# Patient Record
Sex: Female | Born: 1980
Health system: Southern US, Community
[De-identification: ages and names within clinical notes are randomized; demographics above are authoritative.]

## PROBLEM LIST (undated history)

## (undated) DIAGNOSIS — E662 Morbid (severe) obesity with alveolar hypoventilation: Secondary | ICD-10-CM

## (undated) DIAGNOSIS — E876 Hypokalemia: Secondary | ICD-10-CM

## (undated) DIAGNOSIS — L732 Hidradenitis suppurativa: Secondary | ICD-10-CM

## (undated) DIAGNOSIS — E669 Obesity, unspecified: Secondary | ICD-10-CM

## (undated) DIAGNOSIS — Z720 Tobacco use: Secondary | ICD-10-CM

## (undated) HISTORY — PX: TUBAL LIGATION: SHX77

## (undated) HISTORY — PX: DILATION AND CURETTAGE OF UTERUS: SHX78

## (undated) HISTORY — PX: TONSILLECTOMY: SUR1361

---
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
## 2001-01-17 ENCOUNTER — Ambulatory Visit (HOSPITAL_COMMUNITY): Admission: RE | Admit: 2001-01-17 | Discharge: 2001-01-17 | Payer: Self-pay | Admitting: Obstetrics & Gynecology

## 2001-06-25 ENCOUNTER — Emergency Department (HOSPITAL_COMMUNITY): Admission: EM | Admit: 2001-06-25 | Discharge: 2001-06-25 | Payer: Self-pay

## 2002-06-14 ENCOUNTER — Other Ambulatory Visit: Admission: RE | Admit: 2002-06-14 | Discharge: 2002-06-14 | Payer: Self-pay | Admitting: Obstetrics & Gynecology

## 2002-09-23 ENCOUNTER — Emergency Department (HOSPITAL_COMMUNITY): Admission: EM | Admit: 2002-09-23 | Discharge: 2002-09-23 | Payer: Self-pay | Admitting: Emergency Medicine

## 2002-09-24 ENCOUNTER — Ambulatory Visit (HOSPITAL_COMMUNITY): Admission: RE | Admit: 2002-09-24 | Discharge: 2002-09-24 | Payer: Self-pay | Admitting: Obstetrics & Gynecology

## 2002-09-30 ENCOUNTER — Inpatient Hospital Stay (HOSPITAL_COMMUNITY): Admission: AD | Admit: 2002-09-30 | Discharge: 2002-09-30 | Payer: Self-pay | Admitting: Obstetrics and Gynecology

## 2002-12-01 ENCOUNTER — Inpatient Hospital Stay (HOSPITAL_COMMUNITY): Admission: AD | Admit: 2002-12-01 | Discharge: 2002-12-01 | Payer: Self-pay | Admitting: Obstetrics and Gynecology

## 2002-12-03 ENCOUNTER — Inpatient Hospital Stay (HOSPITAL_COMMUNITY): Admission: AD | Admit: 2002-12-03 | Discharge: 2002-12-03 | Payer: Self-pay | Admitting: Obstetrics and Gynecology

## 2002-12-06 ENCOUNTER — Inpatient Hospital Stay (HOSPITAL_COMMUNITY): Admission: AD | Admit: 2002-12-06 | Discharge: 2002-12-09 | Payer: Self-pay | Admitting: Obstetrics & Gynecology

## 2003-01-15 ENCOUNTER — Other Ambulatory Visit: Admission: RE | Admit: 2003-01-15 | Discharge: 2003-01-15 | Payer: Self-pay | Admitting: Obstetrics & Gynecology

## 2003-06-28 ENCOUNTER — Other Ambulatory Visit: Admission: RE | Admit: 2003-06-28 | Discharge: 2003-06-28 | Payer: Self-pay | Admitting: Obstetrics and Gynecology

## 2003-08-24 ENCOUNTER — Emergency Department (HOSPITAL_COMMUNITY): Admission: EM | Admit: 2003-08-24 | Discharge: 2003-08-25 | Payer: Self-pay | Admitting: Emergency Medicine

## 2003-12-21 ENCOUNTER — Emergency Department (HOSPITAL_COMMUNITY): Admission: EM | Admit: 2003-12-21 | Discharge: 2003-12-21 | Payer: Self-pay | Admitting: Emergency Medicine

## 2003-12-23 ENCOUNTER — Ambulatory Visit (HOSPITAL_COMMUNITY): Admission: RE | Admit: 2003-12-23 | Discharge: 2003-12-23 | Payer: Self-pay | Admitting: Emergency Medicine

## 2004-02-07 ENCOUNTER — Ambulatory Visit (HOSPITAL_COMMUNITY): Admission: RE | Admit: 2004-02-07 | Discharge: 2004-02-07 | Payer: Self-pay | Admitting: Obstetrics and Gynecology

## 2004-02-12 ENCOUNTER — Other Ambulatory Visit: Admission: RE | Admit: 2004-02-12 | Discharge: 2004-02-12 | Payer: Self-pay | Admitting: Obstetrics and Gynecology

## 2004-02-21 ENCOUNTER — Ambulatory Visit (HOSPITAL_COMMUNITY): Admission: RE | Admit: 2004-02-21 | Discharge: 2004-02-21 | Payer: Self-pay | Admitting: Obstetrics and Gynecology

## 2004-02-26 ENCOUNTER — Inpatient Hospital Stay (HOSPITAL_COMMUNITY): Admission: AD | Admit: 2004-02-26 | Discharge: 2004-02-27 | Payer: Self-pay | Admitting: Obstetrics and Gynecology

## 2004-09-03 ENCOUNTER — Emergency Department (HOSPITAL_COMMUNITY): Admission: EM | Admit: 2004-09-03 | Discharge: 2004-09-03 | Payer: Self-pay | Admitting: Emergency Medicine

## 2004-11-10 ENCOUNTER — Emergency Department (HOSPITAL_COMMUNITY): Admission: EM | Admit: 2004-11-10 | Discharge: 2004-11-11 | Payer: Self-pay | Admitting: Emergency Medicine

## 2005-04-29 ENCOUNTER — Other Ambulatory Visit: Admission: RE | Admit: 2005-04-29 | Discharge: 2005-04-29 | Payer: Self-pay | Admitting: Obstetrics & Gynecology

## 2006-01-11 ENCOUNTER — Emergency Department (HOSPITAL_COMMUNITY): Admission: EM | Admit: 2006-01-11 | Discharge: 2006-01-11 | Payer: Self-pay | Admitting: Emergency Medicine

## 2006-04-26 ENCOUNTER — Emergency Department (HOSPITAL_COMMUNITY): Admission: EM | Admit: 2006-04-26 | Discharge: 2006-04-26 | Payer: Self-pay | Admitting: Emergency Medicine

## 2006-04-27 ENCOUNTER — Inpatient Hospital Stay (HOSPITAL_COMMUNITY): Admission: AD | Admit: 2006-04-27 | Discharge: 2006-04-27 | Payer: Self-pay | Admitting: Gynecology

## 2007-07-05 ENCOUNTER — Emergency Department (HOSPITAL_COMMUNITY): Admission: EM | Admit: 2007-07-05 | Discharge: 2007-07-05 | Payer: Self-pay | Admitting: Emergency Medicine

## 2007-08-01 ENCOUNTER — Emergency Department (HOSPITAL_COMMUNITY): Admission: EM | Admit: 2007-08-01 | Discharge: 2007-08-01 | Payer: Self-pay | Admitting: Emergency Medicine

## 2007-08-24 ENCOUNTER — Emergency Department (HOSPITAL_COMMUNITY): Admission: EM | Admit: 2007-08-24 | Discharge: 2007-08-25 | Payer: Self-pay | Admitting: Emergency Medicine

## 2008-01-30 ENCOUNTER — Emergency Department (HOSPITAL_COMMUNITY): Admission: EM | Admit: 2008-01-30 | Discharge: 2008-01-30 | Payer: Self-pay | Admitting: Emergency Medicine

## 2008-02-12 ENCOUNTER — Inpatient Hospital Stay (HOSPITAL_COMMUNITY): Admission: AD | Admit: 2008-02-12 | Discharge: 2008-02-12 | Payer: Self-pay | Admitting: Obstetrics & Gynecology

## 2009-10-08 ENCOUNTER — Emergency Department (HOSPITAL_COMMUNITY): Admission: EM | Admit: 2009-10-08 | Discharge: 2009-10-08 | Payer: Self-pay | Admitting: Emergency Medicine

## 2010-01-12 ENCOUNTER — Emergency Department (HOSPITAL_COMMUNITY): Admission: EM | Admit: 2010-01-12 | Discharge: 2010-01-12 | Payer: Self-pay | Admitting: Emergency Medicine

## 2010-12-20 LAB — WET PREP, GENITAL
Clue Cells Wet Prep HPF POC: NONE SEEN
WBC, Wet Prep HPF POC: NONE SEEN
Yeast Wet Prep HPF POC: NONE SEEN

## 2010-12-20 LAB — CBC
HCT: 37.3 % (ref 36.0–46.0)
Hemoglobin: 12.7 g/dL (ref 12.0–15.0)
MCHC: 34 g/dL (ref 30.0–36.0)
Platelets: 282 10*3/uL (ref 150–400)
RBC: 4.69 MIL/uL (ref 3.87–5.11)
RDW: 14.9 % (ref 11.5–15.5)

## 2010-12-20 LAB — DIFFERENTIAL
Basophils Absolute: 0.1 10*3/uL (ref 0.0–0.1)
Lymphs Abs: 3.7 10*3/uL (ref 0.7–4.0)
Monocytes Absolute: 0.6 10*3/uL (ref 0.1–1.0)
Monocytes Relative: 4 % (ref 3–12)

## 2010-12-20 LAB — URINALYSIS, ROUTINE W REFLEX MICROSCOPIC
Bilirubin Urine: NEGATIVE
Glucose, UA: NEGATIVE mg/dL
Ketones, ur: NEGATIVE mg/dL
Nitrite: NEGATIVE
Specific Gravity, Urine: 1.013 (ref 1.005–1.030)

## 2010-12-20 LAB — URINE MICROSCOPIC-ADD ON

## 2010-12-20 LAB — GC/CHLAMYDIA PROBE AMP, GENITAL: GC Probe Amp, Genital: NEGATIVE

## 2011-02-19 NOTE — Discharge Summary (Signed)
NAME:  Elizabeth Campbell, Elizabeth Campbell                           ACCOUNT NO.:  [id number]   MEDICAL RECORD NO.:  [id number]                   PATIENT TYPE:  INP   LOCATION:  9129                                 FACILITY:  [facility]   PHYSICIAN:  Dineen Kid. Rana Snare, M.D.                 DATE OF BIRTH:  21-Mar-1981   DATE OF ADMISSION:  12/06/2002  DATE OF DISCHARGE:  12/09/2002                                 DISCHARGE SUMMARY   ADMITTING DIAGNOSES:  1. Intrauterine pregnancy at 37-1/2 weeks estimated gestational age.  2. Mild pregnancy induced hypertension.   DISCHARGE DIAGNOSES:  1. Status post low transverse cesarean section secondary to nonreassuring     fetal heart tones and failure to progress.  2. Viable female infant.   PROCEDURE:  Primary low transverse cesarean section.   REASON FOR ADMISSION:  Please see dictated H&P.   HOSPITAL COURSE:  The patient was a 30 year old primigravida who was  admitted at 37-1/2 weeks estimated gestational age with mild elevation of  blood pressure and trace proteinuria.  Pregnancy induced hypertension  laboratories were within normal limits.  Amniotomy was performed and Pitocin  IV was administered per protocol to augment labor pattern.  Epidural was  placed for patient's comfort.  Intrauterine pressure catheter was placed for  monitoring of adequate labor.  After several hours of adequate labor patient  showed poor cervical progress.  Fetus also began to show some variable  decelerations with a late component.  Based on nonreassuring fetal heart  rate pattern and poor cervical progress, decision was made to proceed with a  low transverse cesarean section.  The patient was taken to the operating  room where epidural was dosed to an adequate surgical level.  A low  transverse incision was made with the delivery of a viable female infant  weighing 6 pounds 2 ounces with Apgars of 8 at one minute, 9 at five  minutes.  Umbilical cord pH was 7.31.  The patient tolerated  procedure well  and was taken to the recovery room in stable condition.  On postoperative  day one patient had good return of bowel function.  Abdomen was soft.  Abdominal dressing was clean, dry, and intact.  Laboratories revealed  hemoglobin of 9.8, platelet count of 325,000, WBC count of 14.7.  On  postoperative day two patient had mild elevation of temperature to 100.3.  Abdominal dressing was removed.  We found an incision that was clean, dry,  and intact.  Laboratories revealed hemoglobin of 10.1, platelet count of  332,000, WBC count of 14.8.  On postoperative day three patient was  afebrile.  Fundus was firm and nontender.  Incision was clean, dry, and  intact.  Staples were removed and patient was discharged home.   CONDITION ON DISCHARGE:  Good.   DIET:  Regular, as tolerated.   ACTIVITY:  No heavy lifting.  No driving x2  weeks.  No vaginal entry.   FOLLOW UP:  The patient is to follow up in the office in one to two weeks  for an incision check.  She is to call for temperature greater than 100  degrees, persistent nausea and vomiting, heavy vaginal bleeding, and/or  redness or drainage from the incisional site.    DISCHARGE MEDICATIONS:  1. Tylox numbers 30 one p.o. q.4-6h. p.r.n.  2. Motrin 600 mg q.6h.  3. Prenatal vitamins one p.o. daily.  4. Colace one p.o. daily p.r.n.     Julio Sicks, N.P.                        Dineen Kid Rana Snare, M.D.    CC/MEDQ  D:  12/31/2002  T:  12/31/2002  Job:  [id number]

## 2011-02-19 NOTE — Op Note (Signed)
[facility]  Patient:    Elizabeth Campbell, Elizabeth Campbell                        MRN: [id number] Proc. Date: 01/17/01 Adm. Date:  [id number] Attending:  Minette Headland                           Operative Report  PREOPERATIVE DIAGNOSES:  Severe dysmenorrhea, previous history of exploratory laparotomy with left salpingectomy for torsion of left fallopian tube, suspected adhesions and/or endometriosis.  POSTOPERATIVE DIAGNOSES:  Severe dysmenorrhea, previous history of exploratory laparotomy with left salpingectomy for torsion of left fallopian tube, suspected adhesions and/or endometriosis.  No evidence of pelvic endometriosis with adhesions of the sigmoid colon to the left pelvic sidewall and no other abnormal findings.  OPERATION:  Diagnostic laparoscopy, lysis of left pelvic adhesions and uterosacral nerve ablation by bipolar fulguration of uterosacral ligaments.  ANESTHESIA:  General endotracheal anesthesia.  ESTIMATED INTRAOPERATIVE BLOOD LOSS:  5 cc.  INTRAOPERATIVE COMPLICATIONS:  None.  INDICATIONS:  The patient is an 30 year old with significant GYN history with removal of left tube in the past.  She has been tried on continuous oral contraceptives which have not worked very effectively and she continues to complain of very severe dysmenorrhea even on oral contraceptives.  She is admitted now for laparoscopy.  FINDINGS:  Recorded in still photographs retained in the office record and/or as noted above.  DESCRIPTION OF PROCEDURE:  The patient was admitted on the morning of surgery, and brought to the operating room and placed under adequate general endotracheal anesthesia, placed in the dorsal lithotomy position.  Betadine prep of abdomen and perineum and vagina was carried out in the usual fashion. Matter was evacuated with a Robinson catheter.  Cervix was visualized and Hulka tenaculum attached without significant difficulty. Sterile drapes  were applied.  Two small incisions were made, one through an old scar at the umbilicus and one just above the symphysis.  An 11 mm disposable trocar was introduced at the umbilical incision while elevating the anterior abdominal wall.  The trocar did penetrate the omentum but no other structures and there was no bleeding from the omental perforation with the trocar.  Pneumoperitoneum was allowed to accumulate with carbon dioxide gas. Systematic examination of the pelvic and abdominal contents was carried out and ______ were made and retained in the record.  Using the dissecting reticulated scissors the adhesions around the sigmoid colon were dissected, to free it from the lateral pelvic sidewall.  The uterus was  put on stretch with the Hulka tenaculum and the uterosacral ligaments ablated at their junction with the cervix using the bipolar Klepinger forceps. Cauterization of the adhesions bases was also carried out.  Reduced intra-abdominal pressure was used to confirm complete hemostasis.  The procedure was then terminated.  All the gas was allowed to escape from the abdomen.  Skin incisions were closed with interrupted subcuticular sutures of 3-0 Dexon.  Steri-Strips were applied to the lower incision.  Then 0.50% plain Marcaine was injected into the incision sites for postoperative analgesia. The patient was given 30 mg of Toradol slow IV pressure intraoperatively for postoperative analgesia.  She has Vicodin at home to be taken for postoperative pain.  She is to return to the office in 7 to 10 days for postoperative followup.  She is given routine outpatient surgical instructions. DD:  01/17/01 TD:  01/17/01 Job: [id number]

## 2011-02-19 NOTE — H&P (Signed)
NAME:  Elizabeth Campbell, Elizabeth Campbell                           ACCOUNT NO.:  [id number]   MEDICAL RECORD NO.:  [id number]                   PATIENT TYPE:  AMB   LOCATION:  SDC                                  FACILITY:  [facility]   PHYSICIAN:  Zenaida Niece, M.D.             DATE OF BIRTH:  10-Nov-1980   DATE OF ADMISSION:  02/21/2004  DATE OF DISCHARGE:                                HISTORY & PHYSICAL   CHIEF COMPLAINT:  Pelvic pain.   HISTORY OF PRESENT ILLNESS:  This is a 30 year old white female, gravida 1,  para 1-0-0-1 whom I first saw on April 22 of this year. At that time, she  complained of left pelvic pain for three months which radiated to the right  side. The pain was there most of the time and it was worse with her menses.  She has previously been on Depo-Provera with her last injection in October  2004.  She has recently had menses every two weeks for the past three months  and these are very painful.  She also complains of deep dyspareunia for  approximately three months that happens most of the time.  Exam at that time  revealed no adnexal masses but she was tender on the left. Options were  discussed with her at that time and we elected to proceed with a course of  antibiotics with doxycycline and try NuvaRing and over the counter  nonsteroidals.  She then continued to have pain which required Vicodin for  control.  She had a pelvic ultrasound which was normal.  I saw her again on  May 11. She is having regular menses with the NuvaRing but still has  intermittent pain and cramping which is mostly on the left and does still  have dyspareunia.  The Vicodin helps with the pain but keeps her awake. She  does have some urinary urgency and frequency but no nocturia and some  urgency with bowel movements also.  Exam remains consistent without masses  but she is tender on the left.  The patient wishes to proceed with  evaluation with laparoscopy.   PAST OB HISTORY:  In 2004, she had a  cesarean section at 39 weeks for a  nonreassuring fetal heart tracing and baby weighed 6 pounds, 2 ounces.   PAST MEDICAL HISTORY:  Hypothyroidism and obesity.   PAST GYN HISTORY:  History of CIN 1.   PAST SURGICAL HISTORY:  She has had a cesarean section as well as a  laparoscopy followed by a laparotomy with left salpingectomy in 1998 for a  possible torsed tube.  In 2002, she had a laparoscopy with adhesiolysis and  laparoscopic uterosacral nerve ablation.   ALLERGIES:  None known.   CURRENT MEDICATIONS:  Vicodin p.r.n., Tylenol p.r.n., Aleve p.r.n. and  Nuvaring.   SOCIAL HISTORY:  The patient is single and does smoke a 1/2 pack of  cigarettes a day and  denies significant alcohol or drug use.   FAMILY HISTORY:  The patient's mother had cervical cancer.   REVIEW OF SYMPTOMS:  GU and GI as above.  She also complains of fatigue and  possibly being depressed.   PHYSICAL EXAMINATION:  VITAL SIGNS:  Weight is 263 pounds, blood pressure is  146/98, pulse 80.  GENERAL:  This is an obese white female in no acute distress.  NECK:  Supple without lymphadenopathy or thyromegaly.  LUNGS:  Clear to auscultation.  HEART:  Regular rate and rhythm without murmur.  ABDOMEN:  Obese, nontender, without palpable masses and she does have well  healed laparoscopic and transverse scars.  BREASTS:  Examined in the sitting and supine position reveals no dominant  masses, adenopathy, skin change or nipple discharge.  PELVIC:  External genitalia is normal.  On speculum exam, the cervix is  normal and a Pap smear was performed which has returned with ascus.  On  bimanual exam, she has a small anteverted mid plane or slightly tender  uterus with no adnexal masses and she is slightly tender on the left.   ASSESSMENT:  Chronic pelvic pain that has not responded to medical therapy.  Options have been discussed with the patient and although she has had two  prior laparoscopies she does want to proceed  with laparoscopic evaluation.  The risks of surgery including bleeding, infection, and damage to  surrounding organs have been discussed with the patient.   PLAN:  Admit the patient on the day of surgery for an open diagnostic  laparoscopy with possible adhesiolysis and possible fulguration of  endometriosis.                                               Zenaida Niece, M.D.    TDM/MEDQ  D:  02/20/2004  T:  02/20/2004  Job:  [id number]

## 2011-02-19 NOTE — Op Note (Signed)
NAME:  Elizabeth Campbell, CRASS                           ACCOUNT NO.:  [id number]   MEDICAL RECORD NO.:  [id number]                   PATIENT TYPE:  INP   LOCATION:  9129                                 FACILITY:  [facility]   PHYSICIAN:  Freddy Finner, M.D.                DATE OF BIRTH:  Sep 13, 1981   DATE OF PROCEDURE:  12/06/2002  DATE OF DISCHARGE:                                 OPERATIVE REPORT   PREOPERATIVE DIAGNOSES:  1. Intrauterine pregnancy at 37-and-a-half weeks gestation.  2. Nonreassuring fetal heart tracing.  3. Failure to progress in labor.  4. Mild late gestational hypertension.   POSTOPERATIVE DIAGNOSES:  1. Intrauterine pregnancy at 37-and-a-half weeks gestation.  2. Nonreassuring fetal heart tracing.  3. Failure to progress in labor.  4. Mild late gestational hypertension.  5. Delivery of viable female infant, Apgars of 8 and 9.  Cord pH was 7.31.   SURGEON:  Freddy Finner, M.D.   ASSISTANT:  Nurse.   OPERATIVE PROCEDURE:  Primary low transverse cervical cesarean section.   ESTIMATED INTRAOPERATIVE BLOOD LOSS:  600-800 mL.   ANESTHESIA:  Epidural.   INTRAOPERATIVE COMPLICATIONS:  None.   INDICATIONS FOR PROCEDURE:  The patient was admitted in latent phase of  labor with mild increase in blood pressure and a trace of proteinuria.  Her  PIH labs were normal.  Amniotomy was performed.  Subsequently, the patient  was augmented with IV Pitocin.  She was placed under epidural anesthesia.  She had an intrauterine pressure transducer which documented adequate labor  over the last two hours of her labor profile.  She made very slow progress,  and very little if any progress over the last two hours.  Some variable  decelerations were noted which were kind of late in onset.  These were  nonreassuring, and based on her failure to progress to this point is was  elected to proceed with cesarean delivery.   DESCRIPTION OF PROCEDURE:  The patient was brought to the operating  room  where epidural was dosed for surgery.  A Foley catheter was indwelling.  The  abdomen was prepped and draped in the usual fashion.  A lower abdominal  transverse skin incision was made through an old scar and carried sharply  down to the fascia which was entered sharply and extended to the extent of  the skin incision.  The rectus sheath was developed superiorly and  inferiorly with blunt and sharp dissection.  The rectus muscle was divided  in the midline.  The peritoneum was entered sharply and extended bluntly to  the extent of the skin incision.  The bladder blade was placed.  A  transverse incision was made in the visceral peritoneum overlying the lower  uterine segment which was then bluntly dissected off the lower segment.  The  lower segment was entered transversely and extended bluntly in a transverse  direction.  A viable female infant was then delivered without difficulty.  Apgars and cord pH are noted above.  Cord blood was obtained for routine and  for arterial sample.  Placenta and other products of conception were removed  from the uterus and this was confirmed complete by manual exploration.  The  uterus was delivered through the incision.  The right tube and ovary were  normal.  The left ovary was normal.  The left tube is surgically absent.  The uterus itself was normal.  The uterine incision was closed in a single  layer of running locking 0 Monocryl.  A figure-of-eight was required at the  right margin for complete hemostasis.  The bladder flap was reapproximated  with an interrupted 0 Monocryl.  Irrigation was carried out.  Hemostatis was  adequate and complete.  Abdominal incision was closed in layers.  Running 0  Monocryl was used to close the peritoneum and rectus muscles.  The fascia  was closed in a running 0 PDS.  Subcutaneous tissue was closed with running  2-0 plain.  Skin was closed with wide skin staples and covered in Steri-  Strips.  The patient  tolerated the operative procedure well and was taken to  recovery in good condition.                                               Freddy Finner, M.D.    WRN/MEDQ  D:  12/07/2002  T:  12/07/2002  Job:  [id number]

## 2011-02-19 NOTE — Op Note (Signed)
NAME:  Elizabeth Campbell, Elizabeth Campbell                           ACCOUNT NO.:  [id number]   MEDICAL RECORD NO.:  [id number]                   PATIENT TYPE:  AMB   LOCATION:  SDC                                  [facility]ACILITY:  [facility]   PHYSICIAN:  Zenaida Niece, M.D.             DATE OF BIRTH:  14-Mar-1981   DATE OF PROCEDURE:  02/21/2004  DATE OF DISCHARGE:                                 OPERATIVE REPORT   PREOPERATIVE DIAGNOSES:  Pelvic pain.   POSTOPERATIVE DIAGNOSES:  Pelvic pain with left pelvic adhesions.   SURGEON:  Open diagnostic laparoscopy with adhesiolysis.   SURGEON:  Zenaida Niece, M.D.   ANESTHESIA:  General endotracheal tube.   ESTIMATED BLOOD LOSS:  Less than 50 mL.   FINDINGS:  She had adhesions of the left tube and ovary to the left pelvic  sidewall and sigmoid colon.  She had an otherwise normal uterus, right tube  and ovary, appendix, liver edge, gallbladder and no evidence of  endometriosis or other significant pathology.   DESCRIPTION OF PROCEDURE:  The patient was taken to the operating room and  placed in the dorsal supine position. General anesthesia was induced and she  was placed in mobile stirrups. The abdomen was prepped and draped in the  usual sterile fashion, bladder drained with a red rubber catheter, Hulka  tenaculum applied to the cervix for uterine manipulation.  The  infraumbilical skin was then infiltrated with 0.25% Marcaine and a 3 cm  horizontal incision was made.  Dissection was carried down to the fascia  which was elevated and entered sharply.  The peritoneum was then elevated  and entered sharply and bluntly. A pursestring suture of #0 Vicryl was  placed around the fascia and the Hasson cannula inserted.  CO2 gas was used  to insufflate the abdomen.  Inspection with the laparoscope revealed the  above mentioned findings.  A 5 mm port was placed low in the midline under  direct visualization.  No source for pain was found on the right side. The  adhesions on the left side were felt to possibly be due to pain. These  adhesions were taken down sharply and bluntly. Unipolar cautery was used  with the scissors being careful to avoid bowel to take down some of these  adhesions. I was able to free up the left tube and ovary well enough to  trace the tube to its end. The distal tube was adherent to the ovary and the  end of the tube did appear to be slightly clubbed.  The anatomy was  otherwise normal and the ureter was identified well inferior to any  incisions. There was a small amount of bleeding encountered during the blunt  dissection of adhesions but nothing significant.  The remainder of the  pelvis and abdomen was normal. The 5 mm port was removed under direct  visualization. The laparoscope was removed and the Hasson  cannula removed  and gas allowed to deflate from the abdomen. The previously placed  pursestring suture was then tied. The skin incisions were closed with  subcuticular sutures of 4-0  Vicryl followed by Steri-Strips and bandages. The Hulka tenaculum was then  removed from the cervix. The patient was awakened in the operating room,  tolerated the procedure well and was taken to the recovery room in stable  condition. Counts were correct x2 and she had PAS hose all throughout the  procedure.                                               Zenaida Niece, M.D.    TDM/MEDQ  D:  02/21/2004  T:  02/22/2004  Job:  [id number]

## 2011-05-31 ENCOUNTER — Emergency Department (HOSPITAL_COMMUNITY)
Admission: EM | Admit: 2011-05-31 | Discharge: 2011-05-31 | Disposition: A | Discharge status: Home / Self Care | Payer: Self-pay | Attending: Emergency Medicine | Admitting: Emergency Medicine

## 2011-05-31 DIAGNOSIS — R21 Rash and other nonspecific skin eruption: Secondary | ICD-10-CM | POA: Insufficient documentation

## 2011-05-31 DIAGNOSIS — L298 Other pruritus: Secondary | ICD-10-CM | POA: Insufficient documentation

## 2011-05-31 DIAGNOSIS — L089 Local infection of the skin and subcutaneous tissue, unspecified: Secondary | ICD-10-CM | POA: Insufficient documentation

## 2011-05-31 DIAGNOSIS — L259 Unspecified contact dermatitis, unspecified cause: Secondary | ICD-10-CM | POA: Insufficient documentation

## 2011-05-31 DIAGNOSIS — L2989 Other pruritus: Secondary | ICD-10-CM | POA: Insufficient documentation

## 2011-06-30 LAB — HCG, SERUM, QUALITATIVE: Preg, Serum: NEGATIVE

## 2011-06-30 LAB — POCT PREGNANCY, URINE
Operator id: 181461
Preg Test, Ur: NEGATIVE

## 2011-07-14 LAB — URINALYSIS, ROUTINE W REFLEX MICROSCOPIC
Ketones, ur: NEGATIVE
Nitrite: NEGATIVE
Protein, ur: NEGATIVE
Specific Gravity, Urine: 1.001 — ABNORMAL LOW
pH: 6.5

## 2011-07-14 LAB — GC/CHLAMYDIA PROBE AMP, GENITAL: GC Probe Amp, Genital: NEGATIVE

## 2011-07-14 LAB — HEMOGLOBIN AND HEMATOCRIT, BLOOD: HCT: 35.4 — ABNORMAL LOW

## 2011-07-14 LAB — WET PREP, GENITAL
Clue Cells Wet Prep HPF POC: NONE SEEN
WBC, Wet Prep HPF POC: NONE SEEN
Yeast Wet Prep HPF POC: NONE SEEN

## 2011-07-15 LAB — DIFFERENTIAL
Basophils Relative: 0
Eosinophils Absolute: 0.2
Monocytes Relative: 2 — ABNORMAL LOW
Neutrophils Relative %: 73

## 2011-07-15 LAB — CBC
MCHC: 33.5
MCV: 76.5 — ABNORMAL LOW
Platelets: 371
RBC: 4.19
RDW: 16.7 — ABNORMAL HIGH

## 2013-01-03 ENCOUNTER — Encounter (HOSPITAL_BASED_OUTPATIENT_CLINIC_OR_DEPARTMENT_OTHER): Payer: Self-pay | Admitting: *Deleted

## 2013-01-03 ENCOUNTER — Emergency Department (HOSPITAL_BASED_OUTPATIENT_CLINIC_OR_DEPARTMENT_OTHER)
Admission: EM | Admit: 2013-01-03 | Discharge: 2013-01-03 | Disposition: A | Discharge status: Home / Self Care | Payer: Self-pay | Attending: Emergency Medicine | Admitting: Emergency Medicine

## 2013-01-03 ENCOUNTER — Emergency Department (HOSPITAL_BASED_OUTPATIENT_CLINIC_OR_DEPARTMENT_OTHER): Payer: Self-pay

## 2013-01-03 DIAGNOSIS — IMO0002 Reserved for concepts with insufficient information to code with codable children: Secondary | ICD-10-CM | POA: Insufficient documentation

## 2013-01-03 DIAGNOSIS — R42 Dizziness and giddiness: Secondary | ICD-10-CM | POA: Insufficient documentation

## 2013-01-03 DIAGNOSIS — Z3202 Encounter for pregnancy test, result negative: Secondary | ICD-10-CM | POA: Insufficient documentation

## 2013-01-03 DIAGNOSIS — F17200 Nicotine dependence, unspecified, uncomplicated: Secondary | ICD-10-CM | POA: Insufficient documentation

## 2013-01-03 DIAGNOSIS — R109 Unspecified abdominal pain: Secondary | ICD-10-CM | POA: Insufficient documentation

## 2013-01-03 DIAGNOSIS — N921 Excessive and frequent menstruation with irregular cycle: Secondary | ICD-10-CM

## 2013-01-03 DIAGNOSIS — N920 Excessive and frequent menstruation with regular cycle: Secondary | ICD-10-CM | POA: Insufficient documentation

## 2013-01-03 LAB — PREGNANCY, URINE: Preg Test, Ur: NEGATIVE

## 2013-01-03 LAB — CBC
MCH: 26.9 pg (ref 26.0–34.0)
Platelets: 258 10*3/uL (ref 150–400)
RBC: 4.68 MIL/uL (ref 3.87–5.11)

## 2013-01-03 MED ORDER — IBUPROFEN 800 MG PO TABS
800.0000 mg | ORAL_TABLET | Freq: Once | ORAL | Status: AC
Start: 2013-01-03 — End: 2013-01-03
  Administered 2013-01-03: 800 mg via ORAL
  Filled 2013-01-03: qty 1

## 2013-01-03 MED ORDER — MEDROXYPROGESTERONE ACETATE 10 MG PO TABS: 10.0000 mg | ORAL_TABLET | Freq: Every day | ORAL | Status: DC

## 2013-01-03 NOTE — ED Notes (Signed)
Pt ambulated to restroom and back to stretcher with steady gait. nad noted.

## 2013-01-03 NOTE — ED Provider Notes (Signed)
History     CSN: [id number]  Arrival date & time 01/03/13  1257   First MD Initiated Contact with Patient 01/03/13 1319      Chief Complaint  Patient presents with  . Vaginal Bleeding    (Consider location/radiation/quality/duration/timing/severity/associated sxs/prior treatment) HPI Patient reports heavy vaginal bleeding with clots and pelvic pain.  She states that her periods are irregular with the last period being 1 month ago and normal.  Her period started on Sunday and she developed heavy bleeding and cramps Sunday night.  Going through a tampon every 2-3 hours with leakage.  Does usually have some pain with her periods as well as dysparenuria, but this is more than normal.  Had heavy bleeding like this once before when she had a "tube problem" at age 71.  Has had ultrasounds in the past which showed some ovarian cysts, but no definite fibroids.  Reports feeling intermittently dizzy, particularly when suddenly sitting or standing.  No chest pain or shortness of breath.  Not currently using any birth control.  Has been trying to get pregnant with husband for last 10 years without success.  No history of STDs.  No new sexual partners.  No vaginal discharge.  History reviewed. No pertinent past medical history.  Past Surgical History  Procedure Laterality Date  . Laparoscopic tubal ligation    . Tonsillectomy      History reviewed. No pertinent family history.  History  Substance Use Topics  . Smoking status: Current Every Day Smoker -- 0.50 packs/day    Types: Cigarettes  . Smokeless tobacco: Not on file  . Alcohol Use: No    OB History   Grav Para Term Preterm Abortions TAB SAB Ect Mult Living                  Review of Systems  Constitutional: Negative.   HENT: Negative.   Eyes: Negative.   Respiratory: Negative.   Cardiovascular: Negative.   Gastrointestinal: Negative.   Endocrine: Negative.   Genitourinary: Positive for vaginal bleeding, pelvic pain and  dyspareunia. Negative for dysuria, flank pain and vaginal discharge.  Musculoskeletal: Negative.   Skin: Negative.   Neurological: Positive for dizziness and light-headedness.    Allergies  Review of patient's allergies indicates no known allergies.  Home Medications  No current outpatient prescriptions on file.  BP 151/104  Pulse 101  Temp(Src) 98.3 F (36.8 C) (Oral)  Resp 16  Ht 5\' 10"  (1.778 m)  Wt 280 lb (127.007 kg)  BMI 40.18 kg/m2  SpO2 100%  LMP 12/31/2012  Physical Exam  Constitutional: She is oriented to person, place, and time. She appears well-developed and well-nourished. No distress.  HENT:  Head: Normocephalic and atraumatic.  Mouth/Throat: Oropharynx is clear and moist.  Eyes: Conjunctivae are normal. Right eye exhibits no discharge. Left eye exhibits no discharge. No scleral icterus.  Neck: Normal range of motion. Neck supple.  Cardiovascular: Normal rate, regular rhythm, normal heart sounds and intact distal pulses.   No murmur heard. Pulmonary/Chest: Effort normal and breath sounds normal. No respiratory distress. She has no wheezes. She has no rales.  Abdominal: Soft. Bowel sounds are normal. There is tenderness. There is no rebound (lower quadrants and suprapubic) and no guarding.  Genitourinary:  Speculum exam uncomfortable.  Blood clots in vaginal vault, blood seen from cervix.  No discharge noted.  No lacerations of vaginal wall or cervix appreciated.  Diffuse pain with bimanual exam, no uterine or adnexal enlargement.  Musculoskeletal: She exhibits  no edema.  Neurological: She is alert and oriented to person, place, and time. She exhibits normal muscle tone.  Skin: Skin is warm and dry. No rash noted. She is not diaphoretic. No erythema. No pallor.  Psychiatric: She has a normal mood and affect. Thought content normal.    ED Course  Procedures (including critical care time)  Labs Reviewed  CBC - Abnormal; Notable for the following:    WBC 12.4  (*)    All other components within normal limits  PREGNANCY, URINE   No results found.   No diagnosis found.    MDM  32 yo F with vaginal bleeding.  History of irregular periods, obesity and cysts seen on ovaries previously make PCOS a likely possibility.  Current bleeding is heavier than normal with clots and increased pain.  Hemoglobin is normal with negative orthostatics despite report of dizziness.  Pelvic exam notable for clots in vaginal vault and diffuse pelvic tenderness.  Will check a pelvic ultrasound.  Pelvic ultrasound is unremarkable.  Hemoglobin is normal.  Orthostatic vitals are normal.  Will discharge patient home with a prescription for Provera to be used if bleeding continues on Monday.  Encouraged patient to establish with a GYN once her insurance starts in May.  Patient is agreeable with this plan.  BOOTH, Evelean Bigler 01/03/2013, 3:32 PM   Phebe Colla, MD 01/03/13 [phone]

## 2013-01-03 NOTE — ED Notes (Signed)
Pt c/io heavy vaginal bleeding x 4 days with dizziness

## 2013-01-03 NOTE — ED Notes (Signed)
Patient transported to Ultrasound 

## 2013-01-05 NOTE — ED Provider Notes (Signed)
I saw the patient, reviewed the resident's note and I agree with the findings and plan.   .Face to face Exam:  General:  Awake HEENT:  Atraumatic Resp:  Normal effort Abd:  Nondistended Neuro:No focal weakness   Nelia Shi, MD 01/05/13 1129

## 2017-01-27 ENCOUNTER — Encounter (HOSPITAL_COMMUNITY): Payer: Self-pay

## 2017-01-27 ENCOUNTER — Emergency Department (HOSPITAL_COMMUNITY): Payer: Medicaid Other

## 2017-01-27 ENCOUNTER — Inpatient Hospital Stay (HOSPITAL_COMMUNITY)
Admission: EM | Admit: 2017-01-27 | Discharge: 2017-02-01 | DRG: 190 | Disposition: A | Discharge status: Home / Self Care | Payer: Medicaid Other | Attending: Internal Medicine | Admitting: Internal Medicine

## 2017-01-27 DIAGNOSIS — Z6841 Body Mass Index (BMI) 40.0 and over, adult: Secondary | ICD-10-CM

## 2017-01-27 DIAGNOSIS — Z79899 Other long term (current) drug therapy: Secondary | ICD-10-CM

## 2017-01-27 DIAGNOSIS — J209 Acute bronchitis, unspecified: Secondary | ICD-10-CM | POA: Diagnosis present

## 2017-01-27 DIAGNOSIS — Z8249 Family history of ischemic heart disease and other diseases of the circulatory system: Secondary | ICD-10-CM

## 2017-01-27 DIAGNOSIS — R739 Hyperglycemia, unspecified: Secondary | ICD-10-CM | POA: Diagnosis present

## 2017-01-27 DIAGNOSIS — F17210 Nicotine dependence, cigarettes, uncomplicated: Secondary | ICD-10-CM | POA: Diagnosis present

## 2017-01-27 DIAGNOSIS — J9691 Respiratory failure, unspecified with hypoxia: Secondary | ICD-10-CM

## 2017-01-27 DIAGNOSIS — R590 Localized enlarged lymph nodes: Secondary | ICD-10-CM | POA: Diagnosis present

## 2017-01-27 DIAGNOSIS — R21 Rash and other nonspecific skin eruption: Secondary | ICD-10-CM | POA: Diagnosis present

## 2017-01-27 DIAGNOSIS — I272 Pulmonary hypertension, unspecified: Secondary | ICD-10-CM | POA: Diagnosis present

## 2017-01-27 DIAGNOSIS — J9621 Acute and chronic respiratory failure with hypoxia: Secondary | ICD-10-CM

## 2017-01-27 DIAGNOSIS — E662 Morbid (severe) obesity with alveolar hypoventilation: Secondary | ICD-10-CM | POA: Diagnosis present

## 2017-01-27 DIAGNOSIS — R911 Solitary pulmonary nodule: Secondary | ICD-10-CM | POA: Diagnosis present

## 2017-01-27 DIAGNOSIS — D751 Secondary polycythemia: Secondary | ICD-10-CM | POA: Diagnosis present

## 2017-01-27 DIAGNOSIS — Z888 Allergy status to other drugs, medicaments and biological substances status: Secondary | ICD-10-CM

## 2017-01-27 DIAGNOSIS — F411 Generalized anxiety disorder: Secondary | ICD-10-CM | POA: Diagnosis present

## 2017-01-27 DIAGNOSIS — J984 Other disorders of lung: Secondary | ICD-10-CM | POA: Diagnosis present

## 2017-01-27 DIAGNOSIS — J4541 Moderate persistent asthma with (acute) exacerbation: Secondary | ICD-10-CM

## 2017-01-27 DIAGNOSIS — G4733 Obstructive sleep apnea (adult) (pediatric): Secondary | ICD-10-CM

## 2017-01-27 DIAGNOSIS — J9622 Acute and chronic respiratory failure with hypercapnia: Secondary | ICD-10-CM | POA: Diagnosis present

## 2017-01-27 DIAGNOSIS — I10 Essential (primary) hypertension: Secondary | ICD-10-CM | POA: Diagnosis present

## 2017-01-27 DIAGNOSIS — J440 Chronic obstructive pulmonary disease with acute lower respiratory infection: Principal | ICD-10-CM | POA: Diagnosis present

## 2017-01-27 DIAGNOSIS — J9600 Acute respiratory failure, unspecified whether with hypoxia or hypercapnia: Secondary | ICD-10-CM

## 2017-01-27 LAB — CBC
HCT: 49 % — ABNORMAL HIGH (ref 36.0–46.0)
Hemoglobin: 15 g/dL (ref 12.0–15.0)
MCH: 29.2 pg (ref 26.0–34.0)
MCHC: 30.6 g/dL (ref 30.0–36.0)
MCV: 95.5 fL (ref 78.0–100.0)
Platelets: 257 10*3/uL (ref 150–400)
RBC: 5.13 MIL/uL — ABNORMAL HIGH (ref 3.87–5.11)
RDW: 20.8 % — ABNORMAL HIGH (ref 11.5–15.5)
WBC: 14.6 10*3/uL — ABNORMAL HIGH (ref 4.0–10.5)

## 2017-01-27 LAB — BASIC METABOLIC PANEL
Anion gap: 10 (ref 5–15)
BUN: 13 mg/dL (ref 6–20)
CO2: 32 mmol/L (ref 22–32)
Calcium: 9.1 mg/dL (ref 8.9–10.3)
Chloride: 94 mmol/L — ABNORMAL LOW (ref 101–111)
Creatinine, Ser: 0.95 mg/dL (ref 0.44–1.00)
GFR calc Af Amer: >60 mL/min (ref >60)
GFR calc non Af Amer: >60 mL/min (ref >60)
Glucose, Bld: 123 mg/dL — ABNORMAL HIGH (ref 65–99)
Potassium: 3.2 mmol/L — ABNORMAL LOW (ref 3.5–5.1)
Sodium: 136 mmol/L (ref 135–145)

## 2017-01-27 MED ORDER — METHYLPREDNISOLONE SODIUM SUCC 125 MG IJ SOLR
125.0000 mg | Freq: Once | INTRAMUSCULAR | Status: AC
  Administered 2017-01-27: 125 mg via INTRAVENOUS
  Filled 2017-01-27: qty 2

## 2017-01-27 MED ORDER — POTASSIUM CHLORIDE CRYS ER 20 MEQ PO TBCR
40.0000 meq | EXTENDED_RELEASE_TABLET | Freq: Once | ORAL | Status: AC
  Administered 2017-01-27: 40 meq via ORAL
  Filled 2017-01-27: qty 2

## 2017-01-27 MED ORDER — ALBUTEROL (5 MG/ML) CONTINUOUS INHALATION SOLN
10.0000 mg/h | INHALATION_SOLUTION | RESPIRATORY_TRACT | Status: DC
  Administered 2017-01-27: 10 mg/h via RESPIRATORY_TRACT
  Filled 2017-01-27: qty 20

## 2017-01-27 MED ORDER — IPRATROPIUM BROMIDE 0.02 % IN SOLN
0.5000 mg | Freq: Once | RESPIRATORY_TRACT | Status: AC
  Administered 2017-01-27: 0.5 mg via RESPIRATORY_TRACT
  Filled 2017-01-27: qty 2.5

## 2017-01-27 NOTE — ED Triage Notes (Signed)
Pt complains of common cold sx since Monday, tonight she felt like she had a fever and had a productive cough Pt is cyanotic in triage and her O2sats are in the 70's

## 2017-01-27 NOTE — ED Notes (Addendum)
Small red circular rash noted bilaterally on pt's arms. Pt states she had an allergic reaction to something on Monday, but is unsure of what. She said the reaction receded on its own, but she had another reaction to something on Tuesday and had to take a benadryl. Pt is still unsure what the reaction was to.

## 2017-01-27 NOTE — ED Notes (Signed)
ED Provider at bedside. 

## 2017-01-28 ENCOUNTER — Encounter (HOSPITAL_COMMUNITY): Payer: Self-pay

## 2017-01-28 ENCOUNTER — Observation Stay (HOSPITAL_COMMUNITY): Payer: Medicaid Other

## 2017-01-28 DIAGNOSIS — Z6841 Body Mass Index (BMI) 40.0 and over, adult: Secondary | ICD-10-CM | POA: Diagnosis not present

## 2017-01-28 DIAGNOSIS — R590 Localized enlarged lymph nodes: Secondary | ICD-10-CM | POA: Diagnosis present

## 2017-01-28 DIAGNOSIS — J9622 Acute and chronic respiratory failure with hypercapnia: Secondary | ICD-10-CM | POA: Diagnosis present

## 2017-01-28 DIAGNOSIS — I10 Essential (primary) hypertension: Secondary | ICD-10-CM | POA: Diagnosis present

## 2017-01-28 DIAGNOSIS — J9602 Acute respiratory failure with hypercapnia: Secondary | ICD-10-CM

## 2017-01-28 DIAGNOSIS — J4541 Moderate persistent asthma with (acute) exacerbation: Secondary | ICD-10-CM | POA: Diagnosis present

## 2017-01-28 DIAGNOSIS — R21 Rash and other nonspecific skin eruption: Secondary | ICD-10-CM | POA: Diagnosis present

## 2017-01-28 DIAGNOSIS — Z79899 Other long term (current) drug therapy: Secondary | ICD-10-CM | POA: Diagnosis not present

## 2017-01-28 DIAGNOSIS — J9621 Acute and chronic respiratory failure with hypoxia: Secondary | ICD-10-CM | POA: Diagnosis present

## 2017-01-28 DIAGNOSIS — J9691 Respiratory failure, unspecified with hypoxia: Secondary | ICD-10-CM

## 2017-01-28 DIAGNOSIS — J209 Acute bronchitis, unspecified: Secondary | ICD-10-CM | POA: Diagnosis present

## 2017-01-28 DIAGNOSIS — F17210 Nicotine dependence, cigarettes, uncomplicated: Secondary | ICD-10-CM | POA: Diagnosis present

## 2017-01-28 DIAGNOSIS — D751 Secondary polycythemia: Secondary | ICD-10-CM | POA: Diagnosis present

## 2017-01-28 DIAGNOSIS — R0602 Shortness of breath: Secondary | ICD-10-CM | POA: Diagnosis present

## 2017-01-28 DIAGNOSIS — R911 Solitary pulmonary nodule: Secondary | ICD-10-CM | POA: Diagnosis present

## 2017-01-28 DIAGNOSIS — R739 Hyperglycemia, unspecified: Secondary | ICD-10-CM | POA: Diagnosis present

## 2017-01-28 DIAGNOSIS — F411 Generalized anxiety disorder: Secondary | ICD-10-CM | POA: Diagnosis present

## 2017-01-28 DIAGNOSIS — Z8249 Family history of ischemic heart disease and other diseases of the circulatory system: Secondary | ICD-10-CM | POA: Diagnosis not present

## 2017-01-28 DIAGNOSIS — J984 Other disorders of lung: Secondary | ICD-10-CM | POA: Diagnosis present

## 2017-01-28 DIAGNOSIS — J440 Chronic obstructive pulmonary disease with acute lower respiratory infection: Secondary | ICD-10-CM | POA: Diagnosis present

## 2017-01-28 DIAGNOSIS — I272 Pulmonary hypertension, unspecified: Secondary | ICD-10-CM | POA: Diagnosis present

## 2017-01-28 DIAGNOSIS — J9600 Acute respiratory failure, unspecified whether with hypoxia or hypercapnia: Secondary | ICD-10-CM

## 2017-01-28 DIAGNOSIS — J9601 Acute respiratory failure with hypoxia: Secondary | ICD-10-CM

## 2017-01-28 DIAGNOSIS — E662 Morbid (severe) obesity with alveolar hypoventilation: Secondary | ICD-10-CM | POA: Diagnosis present

## 2017-01-28 DIAGNOSIS — Z888 Allergy status to other drugs, medicaments and biological substances status: Secondary | ICD-10-CM | POA: Diagnosis not present

## 2017-01-28 LAB — EXPECTORATED SPUTUM ASSESSMENT W GRAM STAIN, RFLX TO RESP C

## 2017-01-28 LAB — RESPIRATORY PANEL BY PCR
ADENOVIRUS-RVPPCR: NOT DETECTED
Bordetella pertussis: NOT DETECTED
CHLAMYDOPHILA PNEUMONIAE-RVPPCR: NOT DETECTED
CORONAVIRUS NL63-RVPPCR: NOT DETECTED
CORONAVIRUS OC43-RVPPCR: NOT DETECTED
Coronavirus 229E: NOT DETECTED
Coronavirus HKU1: NOT DETECTED
INFLUENZA A-RVPPCR: NOT DETECTED
INFLUENZA B-RVPPCR: NOT DETECTED
MYCOPLASMA PNEUMONIAE-RVPPCR: NOT DETECTED
Metapneumovirus: NOT DETECTED
PARAINFLUENZA VIRUS 1-RVPPCR: NOT DETECTED
PARAINFLUENZA VIRUS 2-RVPPCR: NOT DETECTED
PARAINFLUENZA VIRUS 3-RVPPCR: NOT DETECTED
PARAINFLUENZA VIRUS 4-RVPPCR: NOT DETECTED
RHINOVIRUS / ENTEROVIRUS - RVPPCR: DETECTED — AB
Respiratory Syncytial Virus: NOT DETECTED

## 2017-01-28 LAB — GLUCOSE, CAPILLARY
Glucose-Capillary: 176 mg/dL — ABNORMAL HIGH (ref 65–99)
Glucose-Capillary: 211 mg/dL — ABNORMAL HIGH (ref 65–99)

## 2017-01-28 LAB — CBC
HEMATOCRIT: 52.9 % — AB (ref 36.0–46.0)
HEMOGLOBIN: 15.6 g/dL — AB (ref 12.0–15.0)
MCH: 28.7 pg (ref 26.0–34.0)
MCHC: 29.5 g/dL — AB (ref 30.0–36.0)
MCV: 97.2 fL (ref 78.0–100.0)
Platelets: 197 10*3/uL (ref 150–400)
RBC: 5.44 MIL/uL — ABNORMAL HIGH (ref 3.87–5.11)
RDW: 20.6 % — ABNORMAL HIGH (ref 11.5–15.5)
WBC: 12.3 10*3/uL — ABNORMAL HIGH (ref 4.0–10.5)

## 2017-01-28 LAB — BRAIN NATRIURETIC PEPTIDE: B Natriuretic Peptide: 389.4 pg/mL — ABNORMAL HIGH (ref 0.0–100.0)

## 2017-01-28 LAB — PREGNANCY, URINE: Preg Test, Ur: NEGATIVE

## 2017-01-28 LAB — BLOOD GAS, ARTERIAL
ACID-BASE EXCESS: 6.5 mmol/L — AB (ref 0.0–2.0)
BICARBONATE: 36.6 mmol/L — AB (ref 20.0–28.0)
DRAWN BY: 270211
FIO2: 1
O2 Saturation: 99.4 %
PH ART: 7.265 — AB (ref 7.350–7.450)
PO2 ART: 181 mmHg — AB (ref 83.0–108.0)
Patient temperature: 98.6
pCO2 arterial: 83.3 mmHg (ref 32.0–48.0)

## 2017-01-28 LAB — BASIC METABOLIC PANEL
ANION GAP: 10 (ref 5–15)
BUN: 11 mg/dL (ref 6–20)
CO2: 30 mmol/L (ref 22–32)
Calcium: 8.9 mg/dL (ref 8.9–10.3)
Chloride: 99 mmol/L — ABNORMAL LOW (ref 101–111)
Creatinine, Ser: 0.85 mg/dL (ref 0.44–1.00)
GFR calc Af Amer: >60 mL/min (ref >60)
GFR calc non Af Amer: >60 mL/min (ref >60)
GLUCOSE: 170 mg/dL — AB (ref 65–99)
POTASSIUM: 4.5 mmol/L (ref 3.5–5.1)
Sodium: 139 mmol/L (ref 135–145)

## 2017-01-28 LAB — TSH: TSH: 0.581 u[IU]/mL (ref 0.350–4.500)

## 2017-01-28 LAB — TROPONIN I
TROPONIN I: 0.03 ng/mL — AB (ref <0.03)
Troponin I: 0.03 ng/mL (ref <0.03)

## 2017-01-28 LAB — PROCALCITONIN: Procalcitonin: 0.45 ng/mL

## 2017-01-28 LAB — LACTIC ACID, PLASMA: Lactic Acid, Venous: 2.3 mmol/L (ref 0.5–1.9)

## 2017-01-28 LAB — PROTIME-INR
INR: 1.14
PROTHROMBIN TIME: 14.6 s (ref 11.4–15.2)

## 2017-01-28 LAB — MRSA PCR SCREENING: MRSA BY PCR: NEGATIVE

## 2017-01-28 LAB — SEDIMENTATION RATE: Sed Rate: 20 mm/hr (ref 0–22)

## 2017-01-28 LAB — HIV ANTIBODY (ROUTINE TESTING W REFLEX): HIV Screen 4th Generation wRfx: NONREACTIVE

## 2017-01-28 LAB — APTT: APTT: 29 s (ref 24–36)

## 2017-01-28 MED ORDER — EPINEPHRINE 0.3 MG/0.3ML IJ SOAJ
0.3000 mg | Freq: Once | INTRAMUSCULAR | Status: DC
  Filled 2017-01-28: qty 0.3

## 2017-01-28 MED ORDER — HEPARIN SODIUM (PORCINE) 5000 UNIT/ML IJ SOLN
5000.0000 [IU] | Freq: Three times a day (TID) | INTRAMUSCULAR | Status: DC
  Administered 2017-01-29 – 2017-01-30 (×4): 5000 [IU] via SUBCUTANEOUS
  Filled 2017-01-28 (×8): qty 1

## 2017-01-28 MED ORDER — METHYLPREDNISOLONE SODIUM SUCC 125 MG IJ SOLR: 80.0000 mg | Freq: Four times a day (QID) | INTRAMUSCULAR | Status: DC

## 2017-01-28 MED ORDER — ENOXAPARIN SODIUM 40 MG/0.4ML ~~LOC~~ SOLN
40.0000 mg | Freq: Every day | SUBCUTANEOUS | Status: DC
  Administered 2017-01-28: 40 mg via SUBCUTANEOUS
  Filled 2017-01-28: qty 0.4

## 2017-01-28 MED ORDER — DIPHENHYDRAMINE HCL 50 MG/ML IJ SOLN
12.5000 mg | Freq: Three times a day (TID) | INTRAMUSCULAR | Status: DC
  Administered 2017-01-28 – 2017-01-29 (×3): 12.5 mg via INTRAVENOUS
  Filled 2017-01-28 (×2): qty 1

## 2017-01-28 MED ORDER — ALBUTEROL SULFATE (2.5 MG/3ML) 0.083% IN NEBU
2.5000 mg | INHALATION_SOLUTION | RESPIRATORY_TRACT | Status: DC
  Administered 2017-01-28 (×3): 2.5 mg via RESPIRATORY_TRACT
  Filled 2017-01-28 (×2): qty 3

## 2017-01-28 MED ORDER — ALBUTEROL SULFATE (2.5 MG/3ML) 0.083% IN NEBU
2.5000 mg | INHALATION_SOLUTION | RESPIRATORY_TRACT | Status: DC | PRN
  Administered 2017-01-30: 2.5 mg via RESPIRATORY_TRACT
  Filled 2017-01-28: qty 3

## 2017-01-28 MED ORDER — INSULIN ASPART 100 UNIT/ML ~~LOC~~ SOLN
0.0000 [IU] | Freq: Every day | SUBCUTANEOUS | Status: DC
  Administered 2017-01-28 – 2017-01-31 (×3): 2 [IU] via SUBCUTANEOUS

## 2017-01-28 MED ORDER — ENOXAPARIN SODIUM 60 MG/0.6ML ~~LOC~~ SOLN
60.0000 mg | Freq: Every day | SUBCUTANEOUS | Status: DC
  Filled 2017-01-28: qty 0.6

## 2017-01-28 MED ORDER — FUROSEMIDE 10 MG/ML IJ SOLN
40.0000 mg | Freq: Once | INTRAMUSCULAR | Status: AC
  Administered 2017-01-28: 40 mg via INTRAVENOUS
  Filled 2017-01-28: qty 4

## 2017-01-28 MED ORDER — DIPHENHYDRAMINE HCL 50 MG/ML IJ SOLN: 12.5000 mg | Freq: Three times a day (TID) | INTRAMUSCULAR | Status: DC | PRN

## 2017-01-28 MED ORDER — HEPARIN BOLUS VIA INFUSION
5000.0000 [IU] | Freq: Once | INTRAVENOUS | Status: AC
  Administered 2017-01-28: 5000 [IU] via INTRAVENOUS
  Filled 2017-01-28: qty 5000

## 2017-01-28 MED ORDER — BUDESONIDE 0.25 MG/2ML IN SUSP
0.2500 mg | Freq: Two times a day (BID) | RESPIRATORY_TRACT | Status: DC
  Administered 2017-01-28 – 2017-01-31 (×7): 0.25 mg via RESPIRATORY_TRACT
  Filled 2017-01-28 (×7): qty 2

## 2017-01-28 MED ORDER — ACETAMINOPHEN 325 MG PO TABS: 650.0000 mg | ORAL_TABLET | Freq: Four times a day (QID) | ORAL | Status: DC | PRN

## 2017-01-28 MED ORDER — METHYLPREDNISOLONE SODIUM SUCC 125 MG IJ SOLR
60.0000 mg | Freq: Four times a day (QID) | INTRAMUSCULAR | Status: AC
  Administered 2017-01-28 – 2017-01-29 (×6): 60 mg via INTRAVENOUS
  Filled 2017-01-28 (×5): qty 2

## 2017-01-28 MED ORDER — IOPAMIDOL (ISOVUE-370) INJECTION 76%
INTRAVENOUS | Status: AC
  Administered 2017-01-28: 100 mL via INTRAVENOUS
  Filled 2017-01-28: qty 100

## 2017-01-28 MED ORDER — AZITHROMYCIN 250 MG PO TABS
500.0000 mg | ORAL_TABLET | Freq: Every day | ORAL | Status: DC
  Administered 2017-01-28 – 2017-01-31 (×4): 500 mg via ORAL
  Filled 2017-01-28 (×4): qty 2

## 2017-01-28 MED ORDER — ALBUTEROL (5 MG/ML) CONTINUOUS INHALATION SOLN
10.0000 mg/h | INHALATION_SOLUTION | RESPIRATORY_TRACT | Status: DC
  Administered 2017-01-28: 10 mg/h via RESPIRATORY_TRACT
  Filled 2017-01-28: qty 20

## 2017-01-28 MED ORDER — HEPARIN (PORCINE) IN NACL 100-0.45 UNIT/ML-% IJ SOLN
1750.0000 [IU]/h | INTRAMUSCULAR | Status: DC
  Administered 2017-01-28: 1750 [IU]/h via INTRAVENOUS
  Filled 2017-01-28 (×3): qty 250

## 2017-01-28 MED ORDER — ORAL CARE MOUTH RINSE
15.0000 mL | Freq: Two times a day (BID) | OROMUCOSAL | Status: DC
  Administered 2017-01-30 – 2017-02-01 (×5): 15 mL via OROMUCOSAL

## 2017-01-28 MED ORDER — ONDANSETRON HCL 4 MG PO TABS: 4.0000 mg | ORAL_TABLET | Freq: Four times a day (QID) | ORAL | Status: DC | PRN

## 2017-01-28 MED ORDER — ACETAMINOPHEN 650 MG RE SUPP: 650.0000 mg | Freq: Four times a day (QID) | RECTAL | Status: DC | PRN

## 2017-01-28 MED ORDER — METHYLPREDNISOLONE SODIUM SUCC 40 MG IJ SOLR
40.0000 mg | Freq: Every day | INTRAMUSCULAR | Status: DC
  Administered 2017-01-28: 40 mg via INTRAVENOUS
  Filled 2017-01-28: qty 1

## 2017-01-28 MED ORDER — ONDANSETRON HCL 4 MG/2ML IJ SOLN: 4.0000 mg | Freq: Four times a day (QID) | INTRAMUSCULAR | Status: DC | PRN

## 2017-01-28 MED ORDER — ALBUTEROL SULFATE (2.5 MG/3ML) 0.083% IN NEBU
2.5000 mg | INHALATION_SOLUTION | Freq: Four times a day (QID) | RESPIRATORY_TRACT | Status: DC
  Administered 2017-01-28 – 2017-01-31 (×9): 2.5 mg via RESPIRATORY_TRACT
  Filled 2017-01-28 (×10): qty 3

## 2017-01-28 MED ORDER — IPRATROPIUM-ALBUTEROL 0.5-2.5 (3) MG/3ML IN SOLN
3.0000 mL | Freq: Four times a day (QID) | RESPIRATORY_TRACT | Status: DC
  Administered 2017-01-28: 3 mL via RESPIRATORY_TRACT
  Filled 2017-01-28: qty 3

## 2017-01-28 MED ORDER — FAMOTIDINE IN NACL 20-0.9 MG/50ML-% IV SOLN
20.0000 mg | Freq: Once | INTRAVENOUS | Status: AC
  Administered 2017-01-28: 20 mg via INTRAVENOUS
  Filled 2017-01-28: qty 50

## 2017-01-28 MED ORDER — INSULIN ASPART 100 UNIT/ML ~~LOC~~ SOLN
0.0000 [IU] | Freq: Three times a day (TID) | SUBCUTANEOUS | Status: DC
  Administered 2017-01-28 – 2017-01-29 (×2): 3 [IU] via SUBCUTANEOUS
  Administered 2017-01-29 – 2017-01-30 (×2): 5 [IU] via SUBCUTANEOUS
  Administered 2017-01-30: 3 [IU] via SUBCUTANEOUS
  Administered 2017-01-30: 2 [IU] via SUBCUTANEOUS
  Administered 2017-01-31: 5 [IU] via SUBCUTANEOUS
  Administered 2017-01-31: 2 [IU] via SUBCUTANEOUS

## 2017-01-28 MED ORDER — DEXTROSE 5 % IV SOLN
500.0000 mg | Freq: Every day | INTRAVENOUS | Status: DC
  Administered 2017-01-28: 500 mg via INTRAVENOUS
  Filled 2017-01-28: qty 500

## 2017-01-28 MED ORDER — IPRATROPIUM BROMIDE 0.02 % IN SOLN
1.0000 mg | Freq: Once | RESPIRATORY_TRACT | Status: AC
  Administered 2017-01-28: 1 mg via RESPIRATORY_TRACT
  Filled 2017-01-28: qty 5

## 2017-01-28 NOTE — Progress Notes (Signed)
eLink Physician-Brief Progress Note Patient Name: Elizabeth Campbell DOB: 1981/06/19 MRN: [id number]   Date of Service  01/28/2017  HPI/Events of Note  CTA negative for PE  eICU Interventions  Heparin D/Ced Lasix 40 mg IV ordered Placed on subq heparin     Intervention Category Intermediate Interventions: Other:;Respiratory distress - evaluation and management  DETERDING,ELIZABETH 01/28/2017, 9:45 PM

## 2017-01-28 NOTE — Progress Notes (Signed)
SATURATION QUALIFICATIONS: (This note is used to comply with regulatory documentation for home oxygen)  Patient Saturations on Room Air at Rest = 82% with dyspnea  Patient Saturations on Room Air while Ambulating = 77% with mild dyspnea  Patient Saturations on 6 Liters of oxygen while Ambulating = 93%  Please briefly explain why patient needs home oxygen: Pt audible wheeze, recovers after ambulation, dyspnea with exertion. Requires oxygen to recover.

## 2017-01-28 NOTE — Progress Notes (Addendum)
  PROGRESS NOTE  Patient admitted earlier this morning. See H&P. Patient admitted due to acute hypoxemic respiratory failure secondary to acute bronchitis. Currently being treated with Solu-Medrol, Zithromax, breathing treatments and oxygen. On exam, she was in no respiratory distress, but had diffuse wheezing throughout. After my examination this morning, when she ambulated to the bathroom, oxygen sat was 66%. She required 6 L of nasal cannula oxygen for improvement in oxygenation.    Noralee Stain, DO [facility] [email or web address] Password [facility] 01/28/2017, 10:55 AM    Re-evaluated this afternoon. Patient complaining of diffuse rash over her bilateral arms and legs. She started having this either Monday or Tuesday night prior to coming into the hospital. It had gotten better and then re-appeared today and she is wondering if it's related to breathing treatment for related to steroids. We can try Pepcid and Benadryl for the rash, but I doubt it's related to any of the therapies were giving her as she had this rash before getting to the hospital. She continues to require 6 L of nasal cannula oxygen to maintain sats in the high 80s to low 90s. She does not appear to be in any acute distress. We'll continue to monitor. Respiratory panel is pending. If patient decompensates, discussed with her that our next step would be to transfer to stepdown unit for BiPAP.  Noralee Stain, DO [facility] [email or web address] Password [facility] 01/28/2017, 2:14 PM    Called by RN again regarding patient distress. Rapid response RN at bedside. Patient now with desaturation at rest, rash is much worse with sensation of difficulty getting air in. She states that she feels like her throat is closing. She is conversational, on venti mask, but is very flushed and rash has worsened since I last evaluated her. ABG, epi-pen, transfer to stepdown.   ABG with pH 7.26, CO2 83.3. Bipap ordered. Continuous neb. Epi-pen at  bedside.  Consult PCCM for assistance.  Total time spent > 60 minutes including face-to-face time and on the floor, great than 50% was spent in counseling and coordination of care with the patient.   Time: 9:20am-9:30am Time: 1:45pm-2:14pm Time: 2:45pm-3:17pm   Noralee Stain, DO [facility] [email or web address] Password [email or web address] 01/28/2017, 2:50 PM

## 2017-01-28 NOTE — H&P (Signed)
History and Physical    Elizabeth Campbell VHQ:[id number] DOB: Aug 12, 1981 DOA: 01/27/2017  PCP: No PCP Per Patient  Patient coming from: Home.  Chief Complaint: Shortness of breath.  HPI: Elizabeth Campbell is a 36 y.o. female with history of tobacco abuse presents with a year with complaints of shortness of breath and wheezing since yesterday morning. Denies any chest pain fever or chills. Denies any recent travel or sick contacts.   ED Course: In the patient is found to be wheezing bilaterally with chest x-ray showing nothing acute. EKG was showing sinus tachycardia. Patient was given IV Solu-Medrol and nebulizer. Despite which patient was still hypoxic. Patient was placed on oxygen and admitted for further management of acute bronchitis.  Review of Systems: As per HPI, rest all negative.   History reviewed. No pertinent past medical history.  Past Surgical History:  Procedure Laterality Date  . LAPAROSCOPIC TUBAL LIGATION    . TONSILLECTOMY    . TUBAL LIGATION       reports that she has been smoking Cigarettes.  She has a 15.00 pack-year smoking history. She has never used smokeless tobacco. She reports that she does not drink alcohol or use drugs.  Allergies  Allergen Reactions  . Advil [Ibuprofen] Hives and Itching    Family History  Problem Relation Age of Onset  . Hypertension Mother     Prior to Admission medications   Medication Sig Start Date End Date Taking? Authorizing Provider  acetaminophen (TYLENOL) 500 MG tablet Take 1,000 mg by mouth every 6 (six) hours as needed for mild pain.   Yes Historical Provider, MD  Multiple Vitamin (MULTIVITAMIN WITH MINERALS) TABS tablet Take 1 tablet by mouth daily.   Yes Historical Provider, MD  OVER THE COUNTER MEDICATION Take 1 tablet by mouth daily.   Yes Historical Provider, MD  vitamin B-12 (CYANOCOBALAMIN) 1000 MCG tablet Take 1,000 mcg by mouth daily.   Yes Historical Provider, MD  medroxyPROGESTERone (PROVERA) 10 MG tablet  Take 1 tablet (10 mg total) by mouth daily. Patient not taking: Reported on 01/27/2017 01/03/13   Charm Rings, MD    Physical Exam: Vitals:   01/27/17 2211 01/27/17 2211 01/27/17 2253 01/27/17 2357  BP:   (!) 136/96 133/68  Pulse:   84 87  Resp:   14 17  Temp:      TempSrc:      SpO2: (!) 76% 100% 97% 96%      Constitutional: Moderately built and nourished. Vitals:   01/27/17 2211 01/27/17 2211 01/27/17 2253 01/27/17 2357  BP:   (!) 136/96 133/68  Pulse:   84 87  Resp:   14 17  Temp:      TempSrc:      SpO2: (!) 76% 100% 97% 96%   Eyes: Anicteric. No pallor. ENMT: No discharge from the ears eyes nose and mouth. Neck: No mass felt. No JVD appreciated. Respiratory: Bilateral expiratory wheezes no crepitations. Cardiovascular: S1 and S2 heard no murmurs appreciated. Abdomen: Soft nontender bowel sounds present. Musculoskeletal: No edema. No joint effusion. Skin: No rash. Skin appears warm. Neurologic: Alert awake oriented to time place and person. Moves all extremities. Psychiatric: Appears normal. Normal affect.   Labs on Admission: I have personally reviewed following labs and imaging studies  CBC:  Recent Labs Lab 01/27/17 2220  WBC 14.6*  HGB 15.0  HCT 49.0*  MCV 95.5  PLT 257   Basic Metabolic Panel:  Recent Labs Lab 01/27/17 2220  NA 136  K 3.2*  CL 94*  CO2 32  GLUCOSE 123*  BUN 13  CREATININE 0.95  CALCIUM 9.1   GFR: CrCl cannot be calculated (Unknown ideal weight.). Liver Function Tests: No results for input(s): AST, ALT, ALKPHOS, BILITOT, PROT, ALBUMIN in the last 168 hours. No results for input(s): LIPASE, AMYLASE in the last 168 hours. No results for input(s): AMMONIA in the last 168 hours. Coagulation Profile: No results for input(s): INR, PROTIME in the last 168 hours. Cardiac Enzymes: No results for input(s): CKTOTAL, CKMB, CKMBINDEX, TROPONINI in the last 168 hours. BNP (last 3 results) No results for input(s): PROBNP in the last  8760 hours. HbA1C: No results for input(s): HGBA1C in the last 72 hours. CBG: No results for input(s): GLUCAP in the last 168 hours. Lipid Profile: No results for input(s): CHOL, HDL, LDLCALC, TRIG, CHOLHDL, LDLDIRECT in the last 72 hours. Thyroid Function Tests: No results for input(s): TSH, T4TOTAL, FREET4, T3FREE, THYROIDAB in the last 72 hours. Anemia Panel: No results for input(s): VITAMINB12, FOLATE, FERRITIN, TIBC, IRON, RETICCTPCT in the last 72 hours. Urine analysis:    Component Value Date/Time   COLORURINE YELLOW 10/08/2009 1824   APPEARANCEUR CLEAR 10/08/2009 1824   LABSPEC 1.013 10/08/2009 1824   PHURINE 6.5 10/08/2009 1824   GLUCOSEU NEGATIVE 10/08/2009 1824   HGBUR LARGE (A) 10/08/2009 1824   BILIRUBINUR NEGATIVE 10/08/2009 1824   KETONESUR NEGATIVE 10/08/2009 1824   PROTEINUR NEGATIVE 10/08/2009 1824   UROBILINOGEN 1.0 10/08/2009 1824   NITRITE NEGATIVE 10/08/2009 1824   LEUKOCYTESUR SMALL (A) 10/08/2009 1824   Sepsis Labs: (procalcitonin:4,lacticidven:4) )No results found for this or any previous visit (from the past 240 hour(s)).   Radiological Exams on Admission: Dg Chest Portable 1 View  Result Date: 01/27/2017 CLINICAL DATA:  Dyspnea, fever and wheeze for the past several hours EXAM: PORTABLE CHEST 1 VIEW COMPARISON:  None. FINDINGS: Portable semi upright view of the chest demonstrates borderline cardiomegaly. No aortic aneurysm. No pneumonic consolidation, effusion or pneumothorax. No acute nor suspicious osseous appearing abnormalities. IMPRESSION: No active disease. Electronically Signed   By: Tollie Eth M.D.   On: 01/27/2017 22:53    EKG: Independently reviewed. Sinus tachycardia.  Assessment/Plan Principal Problem:   Acute bronchitis    1. Acute hypoxic respiratory failure secondary to acute bronchitis - I have placed patient on Zithromax IV and IV Solu-Medrol Pulmicort nebulizer. Check sputum cultures. 2. Tobacco abuse - tobacco  cessation counseling requested.  Pregnancy screen is pending.   DVT prophylaxis: Lovenox. Code Status: Full code.  Family Communication: Discussed with patient.  Disposition Plan: Home.  Consults called: None.  Admission status: Observation.    Eduard Clos MD [facility] Pager [phone].  If 7PM-7AM, please contact night-coverage [email or web address] Password [email or web address]  01/28/2017, 1:10 AM

## 2017-01-28 NOTE — Progress Notes (Signed)
Report given to [place], RN, pt continue to be alert and oriented. Pt texted mother to notifiy her of changes. SRP, RN

## 2017-01-28 NOTE — ED Notes (Signed)
Hospitalist at bedside 

## 2017-01-28 NOTE — Progress Notes (Signed)
Pt continue to have increase SOB with and without excertion, desat when up to BR. Rash noted on upper trunk, pt seems to think from breathing treatment, Solmedrol given as ordered, MD notified. SRP, RN

## 2017-01-28 NOTE — Progress Notes (Signed)
RTplaced on BIPAP on pt due to ABG.

## 2017-01-28 NOTE — Consult Note (Signed)
PULMONARY / CRITICAL CARE MEDICINE   Name: Elizabeth Campbell MRN: [id number] DOB: Mar 08, 1981    ADMISSION DATE:  01/27/2017 CONSULTATION DATE:  4/27  REFERRING MD:  Maylene Roes   CHIEF COMPLAINT:  Acute respiratory distress  HISTORY OF PRESENT ILLNESS:   This is a 36 year old aaf w/ h/o tobacco abuse. Admitted on 4/27 w/ CC: ~ 3-4d h/o sinus congestion w/ some post nasal gtt followed by  1 day h/o wheezing and associated shortness of breath. Denied, CP, sick exposure, no cough. CXR in ER w/out acute findings. SHe was hypoxic w/ sats in 70s. Admitted w/ working dx of acute bronchitis and bronchospasm.  Since admit on 4/26 thru 4/27:  -Room air sats at rest 82%-->down to 66% w/ ambulation.  -Developed rash on both arms and legs. Apparently this was initially noted about 3-4d ago after doing yard work but subsided w/ benadryl -sats down to 87% on 6 liters. Rapid response called. Moved to ICU, placed on BIPAP. PCCM asked to eval.    PAST MEDICAL HISTORY :  She  has no past medical history on file.  PAST SURGICAL HISTORY: She  has a past surgical history that includes Laparoscopic tubal ligation; Tonsillectomy; and Tubal ligation.  Allergies  Allergen Reactions  . Advil [Ibuprofen] Hives and Itching    No current facility-administered medications on file prior to encounter.    Current Outpatient Prescriptions on File Prior to Encounter  Medication Sig  . medroxyPROGESTERone (PROVERA) 10 MG tablet Take 1 tablet (10 mg total) by mouth daily. (Patient not taking: Reported on 01/27/2017)    FAMILY HISTORY:  Her indicated that the status of her mother is unknown.    SOCIAL HISTORY: She  reports that she has been smoking Cigarettes.  She has a 15.00 pack-year smoking history. She has never used smokeless tobacco. She reports that she does not drink alcohol or use drugs.  REVIEW OF SYSTEMS:   Per above   SUBJECTIVE:  Feels better on NIPPV  VITAL SIGNS: BP (!) 141/77 (BP Location: Right  Arm)   Pulse 97   Temp 98.6 F (37 C) (Oral)   Resp 20   Ht 5' 10"  (1.778 m)   Wt (!) 324 lb 4.8 oz (147.1 kg)   LMP 01/27/2017   SpO2 94%   BMI 46.53 kg/m   HEMODYNAMICS:    VENTILATOR SETTINGS: FiO2 (%):  [40 %] 40 %  INTAKE / OUTPUT: I/O last 3 completed shifts: In: 250 [IV Piggyback:250] Out: -   PHYSICAL EXAMINATION: General appearance:  Obese 36 Year old female, appears comfortable on NIPPV,   conversant  Eyes: anicteric sclerae , moist conjunctivae; PERRL, EOMI bilaterally. Mouth:  membranes and no mucosal ulcerations; normal hard and soft palate Neck: Trachea midline; neck supple, no JVD Lungs/chest: exp wheeze t/o with associated upper airway wheeze as well, with normal respiratory effort and no intercostal retractions CV: RRR, no MRGs  Abdomen: Soft, non-tender; no masses or HSM Extremities: No peripheral edema or extremity lymphadenopathy Skin: Normal temperature, turgor and texture; raised red rash on upper and lower extremities. Neuro/ Psych: Appropriate affect, alert and oriented to person, place and time  LABS:  BMET  Recent Labs Lab 01/27/17 2220 01/28/17 0541  NA 136 139  K 3.2* 4.5  CL 94* 99*  CO2 32 30  BUN 13 11  CREATININE 0.95 0.85  GLUCOSE 123* 170*    Electrolytes  Recent Labs Lab 01/27/17 2220 01/28/17 0541  CALCIUM 9.1 8.9  CBC  Recent Labs Lab 01/27/17 2220 01/28/17 0541  WBC 14.6* 12.3*  HGB 15.0 15.6*  HCT 49.0* 52.9*  PLT 257 197    Coag's No results for input(s): APTT, INR in the last 168 hours.  Sepsis Markers No results for input(s): LATICACIDVEN, PROCALCITON, O2SATVEN in the last 168 hours.  ABG  Recent Labs Lab 01/28/17 1445  PHART 7.265*  PCO2ART 83.3*  PO2ART 181*    Liver Enzymes No results for input(s): AST, ALT, ALKPHOS, BILITOT, ALBUMIN in the last 168 hours.  Cardiac Enzymes No results for input(s): TROPONINI, PROBNP in the last 168 hours.  Glucose No results for input(s):  GLUCAP in the last 168 hours.  Imaging Dg Chest Portable 1 View  Result Date: 01/27/2017 CLINICAL DATA:  Dyspnea, fever and wheeze for the past several hours EXAM: PORTABLE CHEST 1 VIEW COMPARISON:  None. FINDINGS: Portable semi upright view of the chest demonstrates borderline cardiomegaly. No aortic aneurysm. No pneumonic consolidation, effusion or pneumothorax. No acute nor suspicious osseous appearing abnormalities. IMPRESSION: No active disease. Electronically Signed   By: Ashley Royalty M.D.   On: 01/27/2017 22:53     STUDIES:  ECHO 4/27>>>  CULTURES: rvp 4/26>>>  ANTIBIOTICS: azithro 4/26>>>  SIGNIFICANT EVENTS: CT chest 4/27>>> LINES/TUBES:   DISCUSSION: Acute on chronic hypoxic and hypercarbic resp failure. Most likely asthmatic exacerbation but given chronic resp failure and polycythemia need to consider PE as well as decompensated diastolic HF/cor pulmonale from untreated OSA/OHS.  CT chest ordered as well as ECHO.  Will cont supportive RX for bronchospasm Decide on lasix pending CT scan  ASSESSMENT / PLAN:   Acute on chronic hypercarbic and hypoxic respiratory failure seemingly in the setting of asthmatic exacerbation & bronchospasm  r/o PE r/o systolic and diastolic HF  No h/o asthma but does have allergies. Asthmatic exacerbation w/ bronchospasm from environmental exposure seems reasonable and would unify her rash. Need to also r/o Pulmonary emboli,  and given her polycythemia worry about pulmonary edema in setting decompensated Cor pulmonale, and diastolic dysfxn perhaps due to untreated Obesity hypoventilation and OSA. Her CXR does show some vascular congestion   Plan STAT CT chest ECHO Cont systemic steroids Cont scheduled benadryl and pepcid Cont bronchodilators Send PCT, ESR & BNP as well as trop I  Send TSH BIPAP as needed.  IF CT of chest is negative will give lasix as well.  Cont azith  Rash Plan IV steroids and benadryl   HTN  Plan:  Cont  tele  Consider lasix f/u echo   Polycythemia  Plan Trend cbc LMWH Checking CT chest    Hyperglycemia  Plan ssi    Anxiety  Plan Supportive care    FAMILY  - Updates:   - Inter-disciplinary family meet or Palliative Care meeting due by:  5/4  DVT prophylaxis: LMWH SUP: H2B Diet: reg Activity: br  Disposition : ICU/sdu  My ccm time 45 minutes  Erick Colace ACNP-BC [place] Pager # [phone] OR # [phone] if no answer   01/28/2017, 3:30 PM

## 2017-01-28 NOTE — Progress Notes (Signed)
Pt removed from BiPAP and placed on 7 Lpm [place] HFNC for duration of CT trip.  Pt stable with VS noted.  RN has instructions to call RT if Pt goes into respiratory distress during trip and BiPAP will be readministered during trip.

## 2017-01-28 NOTE — Progress Notes (Addendum)
ANTICOAGULATION CONSULT NOTE - Initial Consult  Pharmacy Consult for IV heparin Indication: empiric treatment for possible PE  Allergies  Allergen Reactions  . Advil [Ibuprofen] Hives and Itching    Patient Measurements: Height:  (177.8 cm) Weight: (!) 322 lb 15.6 oz (146.5 kg) IBW/kg (Calculated) : 68.5 Heparin Dosing Weight: 103.9 kg  Vital Signs: Temp: 98.3 F (36.8 C) (04/27 1600) Temp Source: Axillary (04/27 1600) BP: 119/61 (04/27 1500) Pulse Rate: 87 (04/27 1600)  Labs:  Recent Labs  01/27/17 2220 01/28/17 0541  HGB 15.0 15.6*  HCT 49.0* 52.9*  PLT 257 197  CREATININE 0.95 0.85    Estimated Creatinine Clearance: 146.8 mL/min (by C-G formula based on SCr of 0.85 mg/dL).   Medical History: History reviewed. No pertinent past medical history.   Assessment: 39 y/oF admitted on 01/28/2017 with CC of sinus congestion, wheezing, and dyspnea. Since admission, patient developed progressive hypoxemia needing BIPAP and diffuse erythematous rash. Patient subsequently transferred to ICU. CTa of chest ordered by PCCM. Pharmacy asked to start heparin infusion empirically until PE ruled out.   Today, 01/28/17:  Enoxaparin  SQ x 1 given 4/27 at 0214  CBC: Hgb 15.6, Pltc WNL  CrCl > 100 ml/min  Baseline aPTT, PT/INR pending  No bleeding issues reported  Goal of Therapy:  Heparin level 0.3-0.7 units/ml Monitor platelets by anticoagulation protocol: Yes   Plan:   Heparin 5000 units IV bolus x 1, then start heparin infusion at 1750 units/hr.  Heparin level 6 hours after heparin infusion initiated.  Daily CBC and heparin level while on heparin infusion.  Follow-up CTa of chest, baseline coags.  Monitor closely for s/sx of bleeding.    Greer Pickerel, PharmD, BCPS Pager: [phone] 01/28/2017 6:25 PM

## 2017-01-28 NOTE — Significant Event (Signed)
Rapid Response Event Note  Overview:      Initial Focused Assessment:   Interventions:  Plan of Care (if not transferred):  Event Summary: RRT RN called at 1445 for Resp distress. Upon arrival patient was supine in the bed. She had a rash from head to toe featuring bright red color nonmacular and nonpapular and splotchy. She stated she has been itching since Monday from something but unsure what. She noted that her throat felt swollen also. She was tachypnic with RR 24, Bilateral wheezes heard upon ascultation. Her VS were120/81 (91), HR 114, RR 24, O2 saturations on [place] 6L was 87%. At this time she was placed on a NRB mask. RT at bedside to draw ABG's and patient transported to 1229 to SD unit for further observation. Dr Alvino Chapel was present in room during evaluation.    at      at          [place], [place] F

## 2017-01-28 NOTE — Progress Notes (Signed)
Patient arrived to rm 1428 via stretcher from ED. Able to pivot from stretcher to bed. On simple mask. Patient placed on 5L/[place] with 93% O2 sat. SOB with exertion. Audible wheezing noted. Patient calm, cooperative, and oriented x4. Able to speak in full sentences. Oriented to rm, unit, and staff. Plan of care reviewed. Pt denies questions or concerns at this time.

## 2017-01-28 NOTE — Progress Notes (Signed)
Pt up ambulated to the bathroom to brush teeth, sats upon return is 66% and fingers are dusky and audible wheezing noted, replaced O2 sat increased to 89 to 92% on 6l. SRP, RN

## 2017-01-28 NOTE — Progress Notes (Signed)
Pt only able to ambulate 100 feet without desating, pt ambulated to the door and sat decreased to 82% on RA and walking in the hall to nursing station sats decreased to 77% on RA. SRP, RN

## 2017-01-28 NOTE — Progress Notes (Signed)
Called RR , pt states she is having more difficult taking in breaths, sats 87% on 6l.

## 2017-01-28 NOTE — Progress Notes (Addendum)
RR called pt placed on nonrebreather sats 95%, bil wheezing noted. Will be transferred to 1229. SRP, RN

## 2017-01-29 ENCOUNTER — Other Ambulatory Visit (HOSPITAL_COMMUNITY): Payer: Self-pay

## 2017-01-29 ENCOUNTER — Inpatient Hospital Stay (HOSPITAL_COMMUNITY): Payer: Medicaid Other

## 2017-01-29 DIAGNOSIS — R0600 Dyspnea, unspecified: Secondary | ICD-10-CM

## 2017-01-29 DIAGNOSIS — J206 Acute bronchitis due to rhinovirus: Secondary | ICD-10-CM

## 2017-01-29 LAB — GLUCOSE, CAPILLARY
GLUCOSE-CAPILLARY: 243 mg/dL — AB (ref 65–99)
GLUCOSE-CAPILLARY: 175 mg/dL — AB (ref 65–99)
GLUCOSE-CAPILLARY: 152 mg/dL — AB (ref 65–99)
Glucose-Capillary: 189 mg/dL — ABNORMAL HIGH (ref 65–99)

## 2017-01-29 LAB — CBC WITH DIFFERENTIAL/PLATELET
BASOS ABS: 0 10*3/uL (ref 0.0–0.1)
BASOS PCT: 0 %
EOS PCT: 0 %
Eosinophils Absolute: 0 10*3/uL (ref 0.0–0.7)
HEMATOCRIT: 50 % — AB (ref 36.0–46.0)
Hemoglobin: 14.1 g/dL (ref 12.0–15.0)
LYMPHS PCT: 4 %
Lymphs Abs: 0.8 10*3/uL (ref 0.7–4.0)
MCH: 28.4 pg (ref 26.0–34.0)
MCHC: 28.2 g/dL — ABNORMAL LOW (ref 30.0–36.0)
MCV: 100.6 fL — ABNORMAL HIGH (ref 78.0–100.0)
Monocytes Absolute: 0.6 10*3/uL (ref 0.1–1.0)
Monocytes Relative: 3 %
NEUTROS ABS: 20 10*3/uL — AB (ref 1.7–7.7)
Neutrophils Relative %: 93 %
PLATELETS: 241 10*3/uL (ref 150–400)
RBC: 4.97 MIL/uL (ref 3.87–5.11)
RDW: 20.9 % — AB (ref 11.5–15.5)
WBC: 21.5 10*3/uL — AB (ref 4.0–10.5)

## 2017-01-29 LAB — ECHOCARDIOGRAM COMPLETE
Height: 70 in
WEIGHTICAEL: 5167.58 [oz_av]

## 2017-01-29 LAB — LACTIC ACID, PLASMA: Lactic Acid, Venous: 0.9 mmol/L (ref 0.5–1.9)

## 2017-01-29 LAB — MAGNESIUM: MAGNESIUM: 2 mg/dL (ref 1.7–2.4)

## 2017-01-29 LAB — TROPONIN I: Troponin I: <0.03 ng/mL (ref <0.03)

## 2017-01-29 LAB — PHOSPHORUS: PHOSPHORUS: 3.8 mg/dL (ref 2.5–4.6)

## 2017-01-29 LAB — PROCALCITONIN: PROCALCITONIN: 0.4 ng/mL

## 2017-01-29 MED ORDER — ALPRAZOLAM 1 MG PO TABS: 1.0000 mg | ORAL_TABLET | ORAL | Status: DC

## 2017-01-29 MED ORDER — PREDNISONE 10 MG PO TABS
60.0000 mg | ORAL_TABLET | Freq: Every day | ORAL | Status: DC
  Administered 2017-01-30 – 2017-01-31 (×2): 60 mg via ORAL
  Filled 2017-01-29: qty 1
  Filled 2017-01-29: qty 3

## 2017-01-29 MED ORDER — OXYMETAZOLINE HCL 0.05 % NA SOLN
1.0000 | Freq: Two times a day (BID) | NASAL | Status: AC
  Administered 2017-01-29 – 2017-01-30 (×4): 1 via NASAL
  Filled 2017-01-29: qty 15

## 2017-01-29 MED ORDER — LORATADINE 10 MG PO TABS
10.0000 mg | ORAL_TABLET | Freq: Every day | ORAL | Status: DC
  Administered 2017-01-29 – 2017-02-01 (×4): 10 mg via ORAL
  Filled 2017-01-29 (×4): qty 1

## 2017-01-29 MED ORDER — DIPHENHYDRAMINE HCL 50 MG/ML IJ SOLN
12.5000 mg | Freq: Four times a day (QID) | INTRAMUSCULAR | Status: DC | PRN
  Administered 2017-01-29 – 2017-01-31 (×2): 12.5 mg via INTRAVENOUS
  Filled 2017-01-29 (×3): qty 1

## 2017-01-29 NOTE — Progress Notes (Signed)
[facility] Attending Note  Presenting HPI:  36 y.o. Female with history of tobacco use admitted with 3-4 days of sinus congestion and postnasal drainage followed by one day of wheezing with associated shortness of breath. No sick contacts. No chest pain. Patient was found to be hypoxic with saturations in the 70s. Patient was saturating 82% on room air at rest and decreased to 66% on room air with ambulation. She was noted to have a diffuse rash on both arms and legs approximately 3-4 days prior after doing yard work. Rash subsided with Benadryl. Patient still be saturating 87% on 6 L/m. Rapid response was initiated and patient was subsequently transitioned to the intensive care unit with noninvasive positive pressure ventilation. Critical care was contacted to assist with evaluation.  Subjective:  No distress. Reports dyspnea and wheezing are improving. Continues to have significant sinus congestion. No chest pain or pressure. He is having mild chest tightness. Patient did have a rash again this morning similar to her previous rash.  Review of Systems:  Denies Subjective fever or chills. Denies any abdominal pain or nausea.  Temp:  [97.6 F (36.4 C)-98.6 F (37 C)] 97.6 F (36.4 C) (04/28 0355) Pulse Rate:  [78-103] 88 (04/28 1200) Resp:  [12-25] 19 (04/28 1200) BP: (110-141)/(46-77) 110/46 (04/28 1200) SpO2:  [87 %-99 %] 98 % (04/28 1200) FiO2 (%):  [40 %] 40 % (04/28 0147) Weight:  [322 lb 15.6 oz (146.5 kg)] 322 lb 15.6 oz (146.5 kg) (04/27 1500)   General:  Awake. No distress. Alert.Obese. Integument:  Warm & dry. No bruising on exposed skin. Lacey, reticular rash on forearms. HEENT:  No scleral icterus or injection. Pupils symmetric.  Pulmonary:  Apical wheezing. No accessory muscle use on nasal cannula oxygen. Good aeration bilaterally.  Cardiovascular:  Regular rate & rhythm. No JVD apprecaited.  Neurological:  Cranial nerves grossly in tact. No meningismus.  Moving all 4 extremities equally. Oriented x4.   CBC Latest Ref Rng & Units 01/29/2017 01/28/2017 01/27/2017  WBC 4.0 - 10.5 K/uL 21.5(H) 12.3(H) 14.6(H)  Hemoglobin 12.0 - 15.0 g/dL 45.4 15.6(H) 15.0  Hematocrit 36.0 - 46.0 % 50.0(H) 52.9(H) 49.0(H)  Platelets 150 - 400 K/uL 241 197 257   BMP Latest Ref Rng & Units 01/28/2017 01/27/2017  Glucose 65 - 99 mg/dL 098(J) 191(Y)  BUN 6 - 20 mg/dL 11 13  Creatinine 7.82 - 1.00 mg/dL 9.56 2.13  Sodium 086 - 145 mmol/L 139 136  Potassium 3.5 - 5.1 mmol/L 4.5 3.2(L)  Chloride 101 - 111 mmol/L 99(L) 94(L)  CO2 22 - 32 mmol/L 30 32  Calcium 8.9 - 10.3 mg/dL 8.9 9.1    IMAGING/STUDIES: CTA CHEST 4/27:  Personally reviewed by me. Imaging somewhat degraded by motion. No central pulmonary embolism. Precarinal enlarged lymph node noted. No other pathologically enlarged mediastinal adenopathy.No pleural effusion or thickening. No pericardial effusion. Some dependent atelectasis is noted. Questionable small cystic changes within the lungs bilaterally. Bronchial wall thickening noted.Radiology measured an 8 mm nodule in the periphery of the right upper lobe but on my review this appeared to be only 6 mm. TTE 4/29>>>  MICROBIOLOGY: MRSA PCR 4/27:  Negative HIV 4/27:  Nonreactive Sputum Culture 4/27 >>> Respiratory Viral Panel PCR 4/27:  Rhinovirus  ANTIBIOTICS: Azithromycin 4/27 >>>  ASSESSMENT/PLAN:  36 y.o. Female presenting with acute upper respiratory infection. Physical exam findings and history consistent with acute bronchitis as well versus reactive airways disease. Questionable cystic changes on her lungs with  a 6 mm right upper lobe nodule noted. I do question the accuracy of the patient's pulse oximetry without good plethysmography at bedside today. Additionally, with her degree of sinus congestion the efficacy of intranasal oxygen therapy appears somewhat limited.  1. Acute hypoxic respiratory failure: Continuing to improve.Recommend continuing  to wean FiO2. Also recommend trying a forehead pulse oximetry probe with ambulatory oxygen saturation. Awaiting echocardiogram results.  2. Acute upper respiratory infection/acute bronchitis: Recommend continuing treatment with azithromycin. Awaiting sputum culture result.Adding Afrin Nasal spray to regimen. Recommend continuing scheduled Pulmicort and Albuterol nebulizer therapies. Can likely being to taper Prednisone to  daily starting tomorrow.  3. Right upper lobe nodule: Recommend repeat CT chest without contrast in 3 months to ensure resolution or stability. 4. Mediastinal adenopathy: Likely reactive. This can be reassessed at the same time as her lung nodule with CT imaging. 5. Possible cystic lung disease: Reevaluating with CT chest in 3 months. 6. Tobacco use disorder: Spent a significant amount of time today discussing the need for complete tobacco cessation to prevent worsening of her lung function. Plan for outpatient pulmonary function testing after she recovers.  I have spent a total of 37 minutes of time today caring for the patient, reviewing the patient's electronic medical record, and with more than 50% of that time spent coordinating care with the patient as well as reviewing the continuing plan of care with the patient at bedside and contacting her mother via phone at her request.  Remainder of care as per primary service. We will reassess the patient on 4/30. Please contact me if there are any further questions or concerns before then.   Donna Christen Jamison Neighbor, M.D. [facility] Pager:  [phone] After 3pm or if no response, call [phone] 1:38 PM 01/29/17

## 2017-01-29 NOTE — Progress Notes (Signed)
Pt. seen for scheduled aerosol tx.'s, BiPAP(V-60), remains@ bedside, pt. tolerating HFNC([place]) well @ this time on 5 lpm, RT to monitor.

## 2017-01-29 NOTE — Progress Notes (Signed)
*  PRELIMINARY RESULTS* Echocardiogram 2D Echocardiogram has been performed.  Elizabeth Campbell 01/29/2017, 4:26 PM

## 2017-01-29 NOTE — Progress Notes (Signed)
*  PRELIMINARY RESULTS* Echocardiogram 2D Echocardiogram has been performed.  Jeryl Columbia 01/29/2017, 4:25 PM

## 2017-01-29 NOTE — Progress Notes (Signed)
PROGRESS NOTE    Elizabeth Campbell  ZOX:[id number] DOB: August 25, 1981 DOA: 01/27/2017 PCP: No PCP Per Patient     Brief Narrative:  Elizabeth Campbell is a 36 yo female with history of tobacco abuse presents with a year with complaints of shortness of breath and wheezing since day prior to admission. She was admitted with acute hypoxemic respiratory failure due to acute bronchitis. After admission, she continued to deteriorate despite high flow [place] O2 and was transferred to step down unit for bipap.   Assessment & Plan:   Principal Problem:   Acute bronchitis Active Problems:   Acute respiratory failure (HCC)   Respiratory failure with hypoxia (HCC)   Acute hypoxemic and hypercapnic respiratory failure -Multifactorial, likely underlying undiagnosed lung disease such as asthma, OSA, as well as acute bronchitis, rhinovirus, seasonal allergies -CT chest without PE -Required bipap yesterday and overnight, currently on HFNC O2 -Sputum culture pending  -Echocardiogram pending -Appreciate PCCM -Will need PFT and sleep study as outpatient  -Continue azithromax, breathing tx, claritin, solumedrol   Leukocytosis -In setting of steroids -Trend   8 mm nodule RUL lung nodule -Need outpatient follow up. Discussed with patient today.    DVT prophylaxis: subq hep Code Status: Full Family Communication: spoke with mother over the phone this morning Disposition Plan: pending improvement   Consultants:   PCCM  Procedures:   None  Antimicrobials:   Azithromax     Subjective: Patient feeling much better this morning. Currently on HFNC O2. No chest pain, cough. Rash has improved as well   Objective: Vitals:   01/29/17 0600 01/29/17 0700 01/29/17 0800 01/29/17 0932  BP:   (!) 114/54   Pulse: 80 78 82   Resp: Temp:      TempSrc:      SpO2: 97% 96% (!) 89% 90%  Weight:      Height:        Intake/Output Summary (Last 24 hours) at 01/29/17 1013 Last data filed at  01/29/17 0800  Gross per 24 hour  Intake              480 ml  Output              850 ml  Net             -370 ml   Filed Weights   01/28/17 0100 01/28/17 1500  Weight: (!) 147.1 kg (324 lb 4.8 oz) (!) 146.5 kg (322 lb 15.6 oz)    Examination:  General exam: Appears calm and comfortable, sitting in bed  Respiratory system: +expiratory wheezes. Respiratory effort normal. On HFNC O2  Cardiovascular system: S1 & S2 heard, RRR. No JVD, murmurs, rubs, gallops or clicks. No pedal edema. Gastrointestinal system: Abdomen is nondistended, soft and nontender. No organomegaly or masses felt. Normal bowel sounds heard. Central nervous system: Alert and oriented. No focal neurological deficits. Extremities: Symmetric  Skin: +hives in extremities improved in appearance from yesterday, hives on chest much better  Psychiatry: Judgement and insight appear normal. Mood & affect appropriate.   Data Reviewed: I have personally reviewed following labs and imaging studies  CBC:  Recent Labs Lab 01/27/17 2220 01/28/17 0541 01/29/17 0351  WBC 14.6* 12.3* 21.5*  NEUTROABS  --   --  20.0*  HGB 15.0 15.6* 14.1  HCT 49.0* 52.9* 50.0*  MCV 95.5 97.2 100.6*  PLT 257 197 241   Basic Metabolic Panel:  Recent Labs Lab 01/27/17 2220 01/28/17 0541 01/29/17  0351  NA 136 139  --   K 3.2* 4.5  --   CL 94* 99*  --   CO2 32 30  --   GLUCOSE 123* 170*  --   BUN 13 11  --   CREATININE 0.95 0.85  --   CALCIUM 9.1 8.9  --   MG  --   --  2.0  PHOS  --   --  3.8   GFR: Estimated Creatinine Clearance: 146.8 mL/min (by C-G formula based on SCr of 0.85 mg/dL). Liver Function Tests: No results for input(s): AST, ALT, ALKPHOS, BILITOT, PROT, ALBUMIN in the last 168 hours. No results for input(s): LIPASE, AMYLASE in the last 168 hours. No results for input(s): AMMONIA in the last 168 hours. Coagulation Profile:  Recent Labs Lab 01/28/17 1817  INR 1.14   Cardiac Enzymes:  Recent Labs Lab  01/28/17 1630 01/28/17 2230 01/29/17 0351  TROPONINI 0.03* 0.03* <0.03   BNP (last 3 results) No results for input(s): PROBNP in the last 8760 hours. HbA1C: No results for input(s): HGBA1C in the last 72 hours. CBG:  Recent Labs Lab 01/28/17 1804 01/28/17 2202 01/29/17 0811  GLUCAP 176* 211* 189*   Lipid Profile: No results for input(s): CHOL, HDL, LDLCALC, TRIG, CHOLHDL, LDLDIRECT in the last 72 hours. Thyroid Function Tests:  Recent Labs  01/28/17 1630  TSH 0.581   Anemia Panel: No results for input(s): VITAMINB12, FOLATE, FERRITIN, TIBC, IRON, RETICCTPCT in the last 72 hours. Sepsis Labs:  Recent Labs Lab 01/28/17 1630 01/28/17 1817 01/29/17 0351  PROCALCITON 0.45  --  0.40  LATICACIDVEN  --  2.3* 0.9    Recent Results (from the past 240 hour(s))  Respiratory Panel by PCR     Status: Abnormal   Collection Time: 01/28/17  1:05 PM  Result Value Ref Range Status   Adenovirus NOT DETECTED NOT DETECTED Final   Coronavirus 229E NOT DETECTED NOT DETECTED Final   Coronavirus HKU1 NOT DETECTED NOT DETECTED Final   Coronavirus NL63 NOT DETECTED NOT DETECTED Final   Coronavirus OC43 NOT DETECTED NOT DETECTED Final   Metapneumovirus NOT DETECTED NOT DETECTED Final   Rhinovirus / Enterovirus DETECTED (A) NOT DETECTED Final   Influenza A NOT DETECTED NOT DETECTED Final   Influenza B NOT DETECTED NOT DETECTED Final   Parainfluenza Virus 1 NOT DETECTED NOT DETECTED Final   Parainfluenza Virus 2 NOT DETECTED NOT DETECTED Final   Parainfluenza Virus 3 NOT DETECTED NOT DETECTED Final   Parainfluenza Virus 4 NOT DETECTED NOT DETECTED Final   Respiratory Syncytial Virus NOT DETECTED NOT DETECTED Final   Bordetella pertussis NOT DETECTED NOT DETECTED Final   Chlamydophila pneumoniae NOT DETECTED NOT DETECTED Final   Mycoplasma pneumoniae NOT DETECTED NOT DETECTED Final  Culture, expectorated sputum-assessment     Status: None   Collection Time: 01/28/17  1:11 PM  Result  Value Ref Range Status   Specimen Description SPU  Final   Special Requests NONE  Final   Sputum evaluation THIS SPECIMEN IS ACCEPTABLE FOR SPUTUM CULTURE  Final   Report Status 01/28/2017 FINAL  Final  Culture, respiratory (NON-Expectorated)     Status: None (Preliminary result)   Collection Time: 01/28/17  1:11 PM  Result Value Ref Range Status   Specimen Description SPU  Final   Special Requests NONE Reflexed from [id number]  Final   Gram Stain   Final    ABUNDANT WBC PRESENT,BOTH PMN AND MONONUCLEAR MODERATE GRAM NEGATIVE RODS FEW GRAM NEGATIVE COCCI  IN PAIRS    Culture   Final    CULTURE REINCUBATED FOR BETTER GROWTH Performed at [facility], [address] [place], [place] [address]    Report Status PENDING  Incomplete  MRSA PCR Screening     Status: None   Collection Time: 01/28/17  3:05 PM  Result Value Ref Range Status   MRSA by PCR NEGATIVE NEGATIVE Final    Comment:        The GeneXpert MRSA Assay (FDA approved for NASAL specimens only), is one component of a comprehensive MRSA colonization surveillance program. It is not intended to diagnose MRSA infection nor to guide or monitor treatment for MRSA infections.        Radiology Studies: Ct Angio Chest Pe W Or Wo Contrast  Result Date: 01/28/2017 CLINICAL DATA:  Sudden onset of shortness of breath. EXAM: CT ANGIOGRAPHY CHEST WITH CONTRAST TECHNIQUE: Multidetector CT imaging of the chest was performed using the standard protocol during bolus administration of intravenous contrast. Multiplanar CT image reconstructions and MIPs were obtained to evaluate the vascular anatomy. CONTRAST:  100 mL of Isovue 370 COMPARISON:  Chest x-ray from earlier today FINDINGS: Cardiovascular: The thoracic aorta is normal in caliber with no aneurysm or dissection. Mild cardiomegaly. Respiratory motion limits evaluation of the pulmonary arteries. Within this limitation, no definitive pulmonary emboli identified. Mediastinum/Nodes:  There several collateral vessels in the anterior chest wall of uncertain etiology or significance. Lymph nodes in the bilateral axilla and base of neck are normal. There is a mildly enlarged node in the precarinal region on image 34 measuring 15 mm. A few other shotty nodes are seen in the mediastinum and hila of but no other grossly enlarged nodes are noted. The thyroid and esophagus are normal in appearance. No effusions. Lungs/Pleura: The trachea and mainstem bronchi are normal. Suggested mild bronchial wall thickening. No pneumothorax. Subsegmental atelectasis in the bases, lingula, and right middle lobe. There is a peripheral nodule in the right upper lobe on series 10, image 38 measuring up 8 mm. No other suspicious nodules. No masses. No focal infiltrate. No overt edema. Upper Abdomen: No acute abnormality. Musculoskeletal: No chest wall abnormality. No acute or significant osseous findings. Review of the MIP images confirms the above findings. IMPRESSION: 1. Respiratory motion limits evaluation for pulmonary emboli but no central emboli are identified. 2. Mild cardiomegaly. 3. 8 mm nodule in the periphery of the right upper lobe. This is of low suspicion in a patient of this age unless the patient has a history of malignancy. Non-contrast chest CT at 6-12 months is recommended. If the nodule is stable at time of repeat CT, then future CT at 18-24 months (from today's scan) is considered optional for low-risk patients, but is recommended for high-risk patients. This recommendation follows the consensus statement: Guidelines for Management of Incidental Pulmonary Nodules Detected on CT Images: From the Fleischner Society 2017; Radiology 2017; 284:228-243. 4. Mild bronchial thickening may be seen with bronchitis. 5. A mildly enlarged precarinal node is nonspecific but may be reactive. Recommend follow-up as clinically warranted. Electronically Signed   By: Gerome Sam III M.D   On: 01/28/2017 20:48   Dg  Chest Port 1 View  Result Date: 01/28/2017 CLINICAL DATA:  Shortness of breath and wheezing EXAM: PORTABLE CHEST 1 VIEW COMPARISON:  January 28, 2017 FINDINGS: Stable cardiomegaly. No pneumothorax. No pulmonary nodules, masses, or focal infiltrates. No overt edema. IMPRESSION: Cardiomegaly.  No acute abnormality. Electronically Signed   By: Onalee Hua  Judithe Modest M.D   On: 01/28/2017 15:32   Dg Chest Portable 1 View  Result Date: 01/27/2017 CLINICAL DATA:  Dyspnea, fever and wheeze for the past several hours EXAM: PORTABLE CHEST 1 VIEW COMPARISON:  None. FINDINGS: Portable semi upright view of the chest demonstrates borderline cardiomegaly. No aortic aneurysm. No pneumonic consolidation, effusion or pneumothorax. No acute nor suspicious osseous appearing abnormalities. IMPRESSION: No active disease. Electronically Signed   By: Tollie Eth M.D.   On: 01/27/2017 22:53      Scheduled Meds: . albuterol  2.5 mg Nebulization Q6H  . azithromycin  500 mg Oral QHS  . budesonide (PULMICORT) nebulizer solution  0.25 mg Nebulization BID  . heparin subcutaneous  5,000 Units Subcutaneous Q8H  . insulin aspart  0-15 Units Subcutaneous TID WC  . insulin aspart  0-5 Units Subcutaneous QHS  . loratadine  10 mg Oral Daily  . mouth rinse  15 mL Mouth Rinse BID  . methylPREDNISolone (SOLU-MEDROL) injection  60 mg Intravenous Q6H   Continuous Infusions:   LOS: 1 day    Time spent: 30 minutes   Noralee Stain, DO [facility] [email or web address] Password [email or web address] 01/29/2017, 10:13 AM

## 2017-01-29 NOTE — Final Progress Note (Signed)
Patient c/o feeling like she is getting itchy and just does not feel right.  Explained about IV steroids sometimes make you feel odd, but she feels like she is going to have a panic attack.  Vital signs are fine.  Family is in visiting with patient.  Patient taking a walk around unit to see if that helps.  Continue to monitor patient closely.  Jethro Radke Debroah Loop RN

## 2017-01-29 NOTE — Progress Notes (Signed)
Pt placed back on BiPAP due to desaturations while sleeping.  RN aware

## 2017-01-29 NOTE — Progress Notes (Signed)
Allowed patient to get out of bed to walk on portable oxygen.  Instructed patient not to leave the unit while walking.  Two family members were with the patient.  The patient and visitors walked out of the unit and out of the hospital.  When the RN went looking for the patient the patient was not in the front of the hospital.  Walked around the outside of hospital and found patient walking.  The patient states she did not smoke outside.  Instructed patient not to leave the unit again without supervision.  Patient with some audible wheezing heard when she returned to unit.  Continue to monitor patient closely.  Senia Even Debroah Loop RN

## 2017-01-30 LAB — CBC WITH DIFFERENTIAL/PLATELET
Basophils Absolute: 0 10*3/uL (ref 0.0–0.1)
Basophils Relative: 0 %
EOS ABS: 0 10*3/uL (ref 0.0–0.7)
EOS PCT: 0 %
HCT: 50.5 % — ABNORMAL HIGH (ref 36.0–46.0)
Hemoglobin: 14.1 g/dL (ref 12.0–15.0)
LYMPHS ABS: 0.8 10*3/uL (ref 0.7–4.0)
LYMPHS PCT: 5 %
MCH: 28.3 pg (ref 26.0–34.0)
MCHC: 27.9 g/dL — AB (ref 30.0–36.0)
MCV: 101.4 fL — AB (ref 78.0–100.0)
MONO ABS: 0.5 10*3/uL (ref 0.1–1.0)
Monocytes Relative: 3 %
Neutro Abs: 15.5 10*3/uL — ABNORMAL HIGH (ref 1.7–7.7)
Neutrophils Relative %: 92 %
PLATELETS: 234 10*3/uL (ref 150–400)
RBC: 4.98 MIL/uL (ref 3.87–5.11)
RDW: 20.8 % — ABNORMAL HIGH (ref 11.5–15.5)
WBC: 16.8 10*3/uL — AB (ref 4.0–10.5)

## 2017-01-30 LAB — BASIC METABOLIC PANEL
Anion gap: 8 (ref 5–15)
BUN: 25 mg/dL — AB (ref 6–20)
CO2: 34 mmol/L — ABNORMAL HIGH (ref 22–32)
CREATININE: 0.86 mg/dL (ref 0.44–1.00)
Calcium: 9.3 mg/dL (ref 8.9–10.3)
Chloride: 97 mmol/L — ABNORMAL LOW (ref 101–111)
GFR calc Af Amer: >60 mL/min (ref >60)
GLUCOSE: 146 mg/dL — AB (ref 65–99)
POTASSIUM: 4.9 mmol/L (ref 3.5–5.1)
Sodium: 139 mmol/L (ref 135–145)

## 2017-01-30 LAB — GLUCOSE, CAPILLARY
GLUCOSE-CAPILLARY: 193 mg/dL — AB (ref 65–99)
GLUCOSE-CAPILLARY: 132 mg/dL — AB (ref 65–99)
Glucose-Capillary: 232 mg/dL — ABNORMAL HIGH (ref 65–99)
Glucose-Capillary: 221 mg/dL — ABNORMAL HIGH (ref 65–99)

## 2017-01-30 LAB — MAGNESIUM: Magnesium: 2.2 mg/dL (ref 1.7–2.4)

## 2017-01-30 LAB — PHOSPHORUS: Phosphorus: 3.4 mg/dL (ref 2.5–4.6)

## 2017-01-30 LAB — PROCALCITONIN: PROCALCITONIN: 0.25 ng/mL

## 2017-01-30 NOTE — Progress Notes (Signed)
PROGRESS NOTE    Elizabeth Campbell  RUE:[id number] DOB: 1981/07/10 DOA: 01/27/2017 PCP: No PCP Per Patient     Brief Narrative:  Elizabeth Campbell is a 36 yo female with history of tobacco abuse presents with a year with complaints of shortness of breath and wheezing since day prior to admission. She was admitted with acute hypoxemic respiratory failure due to acute bronchitis. After admission, she continued to deteriorate despite high flow  O2 and was transferred to step down unit for bipap.   Assessment & Plan:   Principal Problem:   Acute bronchitis Active Problems:   Acute respiratory failure (HCC)   Respiratory failure with hypoxia (HCC)   Acute hypoxemic and hypercapnic respiratory failure -Multifactorial, likely underlying undiagnosed lung disease such as asthma, OSA, as well as acute bronchitis, rhinovirus, seasonal allergies -CT chest without PE -Now off bipap, on HFNC this morning  -Sputum culture pending  -Echocardiogram with normal systolic/diastolic function, did show moderate pulmonary hypertension  -Appreciate PCCM -Will need PFT and sleep study as outpatient  -Continue azithromax, breathing tx, claritin, transition to prednisone today  -Wean O2 as able, home desat screen   Leukocytosis -In setting of steroids -Trend   8 mm nodule RUL lung nodule -Need outpatient follow up. Discussed with patient.    DVT prophylaxis: subq hep Code Status: Full Family Communication: spoke with mother over the phone 4/28  Disposition Plan: pending improvement   Consultants:   PCCM  Procedures:   None  Antimicrobials:   Azithromax     Subjective: Patient feeling much better this morning. Had an episode of anxiety attack yesterday afternoon and patient briefly left the unit to calm herself. Told her that she can walk around the unit but not to leave the floor. She denies worsening shortness of breath. She did not require BiPAP overnight.   Objective: Vitals:   01/30/17 0800 01/30/17 0842 01/30/17 0848 01/30/17 0929  BP:    (!) 170/84  Pulse: 74   79  Resp: 13   18  Temp: 98.8 F (37.1 C)   97.6 F (36.4 C)  TempSrc: Oral   Oral  SpO2: 100% 95% 95% 95%  Weight:      Height:        Intake/Output Summary (Last 24 hours) at 01/30/17 1039 Last data filed at 01/29/17 1800  Gross per 24 hour  Intake              680 ml  Output                0 ml  Net              680 ml   Filed Weights   01/28/17 0100 01/28/17 1500  Weight: (!) 147.1 kg (324 lb 4.8 oz) (!) 146.5 kg (322 lb 15.6 oz)    Examination:  General exam: Appears calm and comfortable, sitting in bed  Respiratory system: Diminished breath sounds diffusely. Respiratory effort normal. On HFNC O2  Cardiovascular system: S1 & S2 heard, RRR. No JVD, murmurs, rubs, gallops or clicks. No pedal edema. Gastrointestinal system: Abdomen is nondistended, soft and nontender. No organomegaly or masses felt. Normal bowel sounds heard. Central nervous system: Alert and oriented. No focal neurological deficits. Extremities: Symmetric  Skin: Rash has improved Psychiatry: Judgement and insight appear normal. Mood & affect appropriate.   Data Reviewed: I have personally reviewed following labs and imaging studies  CBC:  Recent Labs Lab 01/27/17 2220 01/28/17 0541 01/29/17 0351 01/30/17  0401  WBC 14.6* 12.3* 21.5* 16.8*  NEUTROABS  --   --  20.0* 15.5*  HGB 15.0 15.6* 14.1 14.1  HCT 49.0* 52.9* 50.0* 50.5*  MCV 95.5 97.2 100.6* 101.4*  PLT 257 197 241 234   Basic Metabolic Panel:  Recent Labs Lab 01/27/17 2220 01/28/17 0541 01/29/17 0351 01/30/17 0401  NA 136 139  --  139  K 3.2* 4.5  --  4.9  CL 94* 99*  --  97*  CO2 32 30  --  34*  GLUCOSE 123* 170*  --  146*  BUN 13 11  --  25*  CREATININE 0.95 0.85  --  0.86  CALCIUM 9.1 8.9  --  9.3  MG  --   --  2.0 2.2  PHOS  --   --  3.8 3.4   GFR: Estimated Creatinine Clearance: 145.1 mL/min (by C-G formula based on SCr of 0.86  mg/dL). Liver Function Tests: No results for input(s): AST, ALT, ALKPHOS, BILITOT, PROT, ALBUMIN in the last 168 hours. No results for input(s): LIPASE, AMYLASE in the last 168 hours. No results for input(s): AMMONIA in the last 168 hours. Coagulation Profile:  Recent Labs Lab 01/28/17 1817  INR 1.14   Cardiac Enzymes:  Recent Labs Lab 01/28/17 1630 01/28/17 2230 01/29/17 0351  TROPONINI 0.03* 0.03* <0.03   BNP (last 3 results) No results for input(s): PROBNP in the last 8760 hours. HbA1C: No results for input(s): HGBA1C in the last 72 hours. CBG:  Recent Labs Lab 01/29/17 0811 01/29/17 1231 01/29/17 1536 01/29/17 2156 01/30/17 0756  GLUCAP 189* 243* 175* 152* 232*   Lipid Profile: No results for input(s): CHOL, HDL, LDLCALC, TRIG, CHOLHDL, LDLDIRECT in the last 72 hours. Thyroid Function Tests:  Recent Labs  01/28/17 1630  TSH 0.581   Anemia Panel: No results for input(s): VITAMINB12, FOLATE, FERRITIN, TIBC, IRON, RETICCTPCT in the last 72 hours. Sepsis Labs:  Recent Labs Lab 01/28/17 1630 01/28/17 1817 01/29/17 0351 01/30/17 0401  PROCALCITON 0.45  --  0.40 0.25  LATICACIDVEN  --  2.3* 0.9  --     Recent Results (from the past 240 hour(s))  Respiratory Panel by PCR     Status: Abnormal   Collection Time: 01/28/17  1:05 PM  Result Value Ref Range Status   Adenovirus NOT DETECTED NOT DETECTED Final   Coronavirus 229E NOT DETECTED NOT DETECTED Final   Coronavirus HKU1 NOT DETECTED NOT DETECTED Final   Coronavirus NL63 NOT DETECTED NOT DETECTED Final   Coronavirus OC43 NOT DETECTED NOT DETECTED Final   Metapneumovirus NOT DETECTED NOT DETECTED Final   Rhinovirus / Enterovirus DETECTED (A) NOT DETECTED Final   Influenza A NOT DETECTED NOT DETECTED Final   Influenza B NOT DETECTED NOT DETECTED Final   Parainfluenza Virus 1 NOT DETECTED NOT DETECTED Final   Parainfluenza Virus 2 NOT DETECTED NOT DETECTED Final   Parainfluenza Virus 3 NOT DETECTED  NOT DETECTED Final   Parainfluenza Virus 4 NOT DETECTED NOT DETECTED Final   Respiratory Syncytial Virus NOT DETECTED NOT DETECTED Final   Bordetella pertussis NOT DETECTED NOT DETECTED Final   Chlamydophila pneumoniae NOT DETECTED NOT DETECTED Final   Mycoplasma pneumoniae NOT DETECTED NOT DETECTED Final  Culture, expectorated sputum-assessment     Status: None   Collection Time: 01/28/17  1:11 PM  Result Value Ref Range Status   Specimen Description SPU  Final   Special Requests NONE  Final   Sputum evaluation THIS SPECIMEN IS ACCEPTABLE FOR  SPUTUM CULTURE  Final   Report Status 01/28/2017 FINAL  Final  Culture, respiratory (NON-Expectorated)     Status: None (Preliminary result)   Collection Time: 01/28/17  1:11 PM  Result Value Ref Range Status   Specimen Description SPU  Final   Special Requests NONE Reflexed from [id number]  Final   Gram Stain   Final    ABUNDANT WBC PRESENT,BOTH PMN AND MONONUCLEAR MODERATE GRAM NEGATIVE RODS FEW GRAM NEGATIVE COCCI IN PAIRS    Culture   Final    CULTURE REINCUBATED FOR BETTER GROWTH Performed at [facility], [address] [place], [place] [address]    Report Status PENDING  Incomplete  MRSA PCR Screening     Status: None   Collection Time: 01/28/17  3:05 PM  Result Value Ref Range Status   MRSA by PCR NEGATIVE NEGATIVE Final    Comment:        The GeneXpert MRSA Assay (FDA approved for NASAL specimens only), is one component of a comprehensive MRSA colonization surveillance program. It is not intended to diagnose MRSA infection nor to guide or monitor treatment for MRSA infections.        Radiology Studies: Ct Angio Chest Pe W Or Wo Contrast  Result Date: 01/28/2017 CLINICAL DATA:  Sudden onset of shortness of breath. EXAM: CT ANGIOGRAPHY CHEST WITH CONTRAST TECHNIQUE: Multidetector CT imaging of the chest was performed using the standard protocol during bolus administration of intravenous contrast. Multiplanar CT image  reconstructions and MIPs were obtained to evaluate the vascular anatomy. CONTRAST:  100 mL of Isovue 370 COMPARISON:  Chest x-ray from earlier today FINDINGS: Cardiovascular: The thoracic aorta is normal in caliber with no aneurysm or dissection. Mild cardiomegaly. Respiratory motion limits evaluation of the pulmonary arteries. Within this limitation, no definitive pulmonary emboli identified. Mediastinum/Nodes: There several collateral vessels in the anterior chest wall of uncertain etiology or significance. Lymph nodes in the bilateral axilla and base of neck are normal. There is a mildly enlarged node in the precarinal region on image 34 measuring 15 mm. A few other shotty nodes are seen in the mediastinum and hila of but no other grossly enlarged nodes are noted. The thyroid and esophagus are normal in appearance. No effusions. Lungs/Pleura: The trachea and mainstem bronchi are normal. Suggested mild bronchial wall thickening. No pneumothorax. Subsegmental atelectasis in the bases, lingula, and right middle lobe. There is a peripheral nodule in the right upper lobe on series 10, image 38 measuring up 8 mm. No other suspicious nodules. No masses. No focal infiltrate. No overt edema. Upper Abdomen: No acute abnormality. Musculoskeletal: No chest wall abnormality. No acute or significant osseous findings. Review of the MIP images confirms the above findings. IMPRESSION: 1. Respiratory motion limits evaluation for pulmonary emboli but no central emboli are identified. 2. Mild cardiomegaly. 3. 8 mm nodule in the periphery of the right upper lobe. This is of low suspicion in a patient of this age unless the patient has a history of malignancy. Non-contrast chest CT at 6-12 months is recommended. If the nodule is stable at time of repeat CT, then future CT at 18-24 months (from today's scan) is considered optional for low-risk patients, but is recommended for high-risk patients. This recommendation follows the  consensus statement: Guidelines for Management of Incidental Pulmonary Nodules Detected on CT Images: From the Fleischner Society 2017; Radiology 2017; 284:228-243. 4. Mild bronchial thickening may be seen with bronchitis. 5. A mildly enlarged precarinal node is nonspecific but may  be reactive. Recommend follow-up as clinically warranted. Electronically Signed   By: Gerome Sam III M.D   On: 01/28/2017 20:48   Dg Chest Port 1 View  Result Date: 01/28/2017 CLINICAL DATA:  Shortness of breath and wheezing EXAM: PORTABLE CHEST 1 VIEW COMPARISON:  January 28, 2017 FINDINGS: Stable cardiomegaly. No pneumothorax. No pulmonary nodules, masses, or focal infiltrates. No overt edema. IMPRESSION: Cardiomegaly.  No acute abnormality. Electronically Signed   By: Gerome Sam III M.D   On: 01/28/2017 15:32      Scheduled Meds: . albuterol  2.5 mg Nebulization Q6H  . azithromycin  500 mg Oral QHS  . budesonide (PULMICORT) nebulizer solution  0.25 mg Nebulization BID  . heparin subcutaneous  5,000 Units Subcutaneous Q8H  . insulin aspart  0-15 Units Subcutaneous TID WC  . insulin aspart  0-5 Units Subcutaneous QHS  . loratadine  10 mg Oral Daily  . mouth rinse  15 mL Mouth Rinse BID  . oxymetazoline  1 spray Each Nare BID  . predniSONE  60 mg Oral Q breakfast   Continuous Infusions:   LOS: 2 days    Time spent: 30 minutes   Noralee Stain, DO [facility] [email or web address] Password [facility] 01/30/2017, 10:39 AM

## 2017-01-30 NOTE — Progress Notes (Signed)
PT Cancellation Note  Patient Details Name: Elizabeth Campbell MRN: [id number] DOB: August 06, 1981   Cancelled Treatment:    Reason Eval/Treat Not Completed: PT screened, no needs identified, will sign off. Nursing reports that the pt is ambulating independently and has no mobility needs at this time. Talked with pt who also reports getting around on her own and she denies any concerns regarding her mobility.    Christiane Ha, PT, CSCS Pager [phone] Office [phone]  01/30/2017, 8:23 AM

## 2017-01-31 LAB — BLOOD GAS, ARTERIAL
Acid-Base Excess: 7.6 mmol/L — ABNORMAL HIGH (ref 0.0–2.0)
BICARBONATE: 38.9 mmol/L — AB (ref 20.0–28.0)
DRAWN BY: 276051
O2 CONTENT: 5 L/min
O2 SAT: 96 %
PH ART: 7.242 — AB (ref 7.350–7.450)
Patient temperature: 98.6
pCO2 arterial: 93.7 mmHg (ref 32.0–48.0)
pO2, Arterial: 98.1 mmHg (ref 83.0–108.0)

## 2017-01-31 LAB — CULTURE, RESPIRATORY W GRAM STAIN: Culture: NORMAL

## 2017-01-31 LAB — GLUCOSE, CAPILLARY
GLUCOSE-CAPILLARY: 91 mg/dL (ref 65–99)
Glucose-Capillary: 99 mg/dL (ref 65–99)
Glucose-Capillary: 141 mg/dL — ABNORMAL HIGH (ref 65–99)
Glucose-Capillary: 226 mg/dL — ABNORMAL HIGH (ref 65–99)
Glucose-Capillary: 233 mg/dL — ABNORMAL HIGH (ref 65–99)

## 2017-01-31 LAB — CBC
HCT: 49.4 % — ABNORMAL HIGH (ref 36.0–46.0)
Hemoglobin: 13.9 g/dL (ref 12.0–15.0)
MCH: 28.4 pg (ref 26.0–34.0)
MCHC: 28.1 g/dL — ABNORMAL LOW (ref 30.0–36.0)
MCV: 100.8 fL — AB (ref 78.0–100.0)
PLATELETS: 237 10*3/uL (ref 150–400)
RBC: 4.9 MIL/uL (ref 3.87–5.11)
RDW: 20.9 % — AB (ref 11.5–15.5)
WBC: 15.2 10*3/uL — ABNORMAL HIGH (ref 4.0–10.5)

## 2017-01-31 LAB — CULTURE, RESPIRATORY

## 2017-01-31 LAB — HEMOGLOBIN A1C
HEMOGLOBIN A1C: 5.9 % — AB (ref 4.8–5.6)
Mean Plasma Glucose: 123 mg/dL

## 2017-01-31 MED ORDER — FLUTICASONE PROPIONATE 50 MCG/ACT NA SUSP: 2.0000 | Freq: Every day | NASAL | 0 refills | Status: DC

## 2017-01-31 MED ORDER — FAMOTIDINE 20 MG PO TABS
20.0000 mg | ORAL_TABLET | Freq: Two times a day (BID) | ORAL | Status: DC
  Administered 2017-01-31 – 2017-02-01 (×3): 20 mg via ORAL
  Filled 2017-01-31 (×3): qty 1

## 2017-01-31 MED ORDER — PREDNISONE 10 MG PO TABS: ORAL_TABLET | ORAL | 0 refills | Status: DC

## 2017-01-31 MED ORDER — FAMOTIDINE 20 MG PO TABS: 20.0000 mg | ORAL_TABLET | Freq: Two times a day (BID) | ORAL | 0 refills | Status: DC

## 2017-01-31 MED ORDER — ALBUTEROL SULFATE (2.5 MG/3ML) 0.083% IN NEBU
2.5000 mg | INHALATION_SOLUTION | Freq: Four times a day (QID) | RESPIRATORY_TRACT | Status: DC
  Administered 2017-01-31 – 2017-02-01 (×6): 2.5 mg via RESPIRATORY_TRACT
  Filled 2017-01-31 (×6): qty 3

## 2017-01-31 MED ORDER — AZITHROMYCIN 250 MG PO TABS: 250.0000 mg | ORAL_TABLET | Freq: Every day | ORAL | 0 refills | Status: AC

## 2017-01-31 MED ORDER — PREDNISONE 20 MG PO TABS
40.0000 mg | ORAL_TABLET | Freq: Every day | ORAL | Status: DC
  Administered 2017-02-01: 40 mg via ORAL
  Filled 2017-01-31: qty 2

## 2017-01-31 MED ORDER — BUDESONIDE 0.5 MG/2ML IN SUSP
0.5000 mg | Freq: Two times a day (BID) | RESPIRATORY_TRACT | Status: DC
  Administered 2017-01-31 – 2017-02-01 (×2): 0.5 mg via RESPIRATORY_TRACT
  Filled 2017-01-31 (×2): qty 2

## 2017-01-31 MED ORDER — CETIRIZINE HCL 10 MG PO CAPS: 10.0000 mg | ORAL_CAPSULE | Freq: Every day | ORAL | 0 refills | Status: DC

## 2017-01-31 MED ORDER — DIPHENHYDRAMINE HCL 25 MG PO CAPS: 25.0000 mg | ORAL_CAPSULE | Freq: Every day | ORAL | 0 refills | Status: DC

## 2017-01-31 MED ORDER — BUDESONIDE 90 MCG/ACT IN AEPB: 1.0000 | INHALATION_SPRAY | Freq: Two times a day (BID) | RESPIRATORY_TRACT | 0 refills | Status: DC

## 2017-01-31 MED ORDER — SALINE SPRAY 0.65 % NA SOLN: 2.0000 | Freq: Two times a day (BID) | NASAL | 0 refills | Status: DC

## 2017-01-31 MED ORDER — FLUTICASONE PROPIONATE 50 MCG/ACT NA SUSP
2.0000 | Freq: Every day | NASAL | Status: DC
  Administered 2017-01-31 – 2017-02-01 (×2): 2 via NASAL
  Filled 2017-01-31: qty 16

## 2017-01-31 MED ORDER — ALBUTEROL SULFATE HFA 108 (90 BASE) MCG/ACT IN AERS: 2.0000 | INHALATION_SPRAY | Freq: Four times a day (QID) | RESPIRATORY_TRACT | 0 refills | Status: DC | PRN

## 2017-01-31 MED ORDER — SALINE SPRAY 0.65 % NA SOLN
2.0000 | Freq: Two times a day (BID) | NASAL | Status: DC
  Administered 2017-01-31 – 2017-02-01 (×3): 2 via NASAL
  Filled 2017-01-31: qty 44

## 2017-01-31 MED ORDER — DIPHENHYDRAMINE HCL 25 MG PO CAPS
25.0000 mg | ORAL_CAPSULE | Freq: Every day | ORAL | Status: DC
  Administered 2017-01-31 – 2017-02-01 (×2): 25 mg via ORAL
  Filled 2017-01-31 (×2): qty 1

## 2017-01-31 NOTE — Progress Notes (Signed)
SATURATION QUALIFICATIONS: (This note is used to comply with regulatory documentation for home oxygen)  Patient Saturations on Room Air at Rest = 85%  Patient Saturations on Room Air while Ambulating = <78%  Patient Saturations on 6 Liters of oxygen while Ambulating = 92%

## 2017-01-31 NOTE — Discharge Summary (Addendum)
Physician Discharge Summary  Elizabeth Campbell BJY:[id number] DOB: 29-Jun-1981 DOA: 01/27/2017  PCP: No PCP Per Patient  Admit date: 01/27/2017 Discharge date: 02/01/2017  Admitted From: home Disposition:  home  Recommendations for Outpatient Follow-up:  1. Follow up with PCP in 1 week  2. Follow up with Pulmonology on 5/9 at 3pm  3. Sleep study scheduled for 04/12/2017  4. Will likely need formal allergy evaluation as outpatient.   5. Please obtain CBC in 1 week to ensure resolution of leukocytosis   Home Health: No  Equipment/Devices: Home O2   Discharge Condition: Stable CODE STATUS: Full  Diet recommendation: Heart healthy   Brief/Interim Summary: Elizabeth Campbell is a 36 y.o. female with history of tobacco abuse presents with a year with complaints of shortness of breath and wheezing since yesterday morning. Denies any chest pain fever or chills. Denies any recent travel or sick contacts. She was treated for acute bronchitis as well as acute hypoxemic/hypercapnic respiratory failure with azithromycin, Solu-Medrol. On the afternoon of 4/27, patient decompensated very quickly and was transferred to stepdown unit for BiPAP. PCCM was consulted. She underwent CTA chest which was negative for pulmonary embolism. Echocardiogram was completed which showed pulmonary hypertension. Patient was weaned off BiPAP and oxygen needs slowly decreased. She did qualify for home oxygen on discharge as well as trilogy vent.   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 -Multifactorial, likely underlying undiagnosed lung disease such as asthma, OSA, as well as acute bronchitis, rhinovirus, seasonal allergies -CT chest without  PE -Echocardiogram with normal systolic/diastolic function, did show moderate pulmonary hypertension  -Now off bipap -Sputum culture with normal flora  -Appreciate PCCM -Will need PFT and sleep study as outpatient  -Continue azithromax, prednisone taper, flonase + nasal saline, pulmicort + albuterol  -Discharge with home O2 and bipap/trilogy vent qhs   Leukocytosis -In setting of steroids -Trend   Rash -Benadryl, pepcid, zyrtec  -Recommend outpatient allergy evaluation   8 mm nodule RUL lung nodule -Need outpatient follow up CT in 3 months  Tobacco use -Cessation counseling     Discharge Instructions  Discharge Instructions    Call MD for:  difficulty breathing, headache or visual disturbances    Complete by:  As directed    Call MD for:  extreme fatigue    Complete by:  As directed    Call MD for:  hives    Complete by:  As directed    Call MD for:  persistant dizziness or light-headedness    Complete by:  As directed    Call MD for:  persistant nausea and vomiting    Complete by:  As directed    Call MD for:  severe uncontrolled pain    Complete by:  As directed    Call MD for:  temperature >100.4    Complete by:  As directed    Diet - low sodium heart healthy    Complete by:  As directed    Discharge instructions    Complete by:  As directed    You were cared for by a hospitalist during your hospital stay. If you have any questions about your discharge medications or the care you received while you were in the hospital after you are discharged, you  can call the unit and asked to speak with the hospitalist on call if the hospitalist that took care of you is not available. Once you are discharged, your primary care physician will handle any further medical issues. Please note that NO REFILLS for any discharge medications will be authorized once you are discharged, as it is imperative that you return to your primary care physician (or establish a relationship with a  primary care physician if you do not have one) for your aftercare needs so that they can reassess your need for medications and monitor your lab values.   Increase activity slowly    Complete by:  As directed      Allergies as of 02/01/2017      Reactions   Advil [ibuprofen] Hives, Itching      Medication List    STOP taking these medications   acetaminophen 500 MG tablet Commonly known as:  TYLENOL   medroxyPROGESTERone 10 MG tablet Commonly known as:  PROVERA     TAKE these medications   albuterol 108 (90 Base) MCG/ACT inhaler Commonly known as:  PROVENTIL HFA;VENTOLIN HFA Inhale 2 puffs into the lungs every 6 (six) hours as needed for wheezing or shortness of breath.   azithromycin 250 MG tablet Commonly known as:  ZITHROMAX Take 1 tablet (250 mg total) by mouth daily.   Budesonide 90 MCG/ACT inhaler Commonly known as:  PULMICORT FLEXHALER Inhale 1 puff into the lungs 2 (two) times daily.   Cetirizine HCl 10 MG Caps Commonly known as:  ZYRTEC ALLERGY Take 1 capsule (10 mg total) by mouth daily.   diphenhydrAMINE 25 mg capsule Commonly known as:  BENADRYL Take 1 capsule (25 mg total) by mouth daily.   famotidine 20 MG tablet Commonly known as:  PEPCID Take 1 tablet (20 mg total) by mouth 2 (two) times daily.   fluticasone 50 MCG/ACT nasal spray Commonly known as:  FLONASE Place 2 sprays into both nostrils daily.   multivitamin with minerals Tabs tablet Take 1 tablet by mouth daily.   OVER THE COUNTER MEDICATION Take 1 tablet by mouth daily.   predniSONE 10 MG tablet Commonly known as:  DELTASONE Take 4 tabs for 1 day, then 3 tabs for 3 days, then 2 tabs for 3 days, then 1 tab for 3 days   sodium chloride 0.65 % Soln nasal spray Commonly known as:  OCEAN Place 2 sprays into both nostrils 2 (two) times daily.   vitamin B-12 1000 MCG tablet Commonly known as:  CYANOCOBALAMIN Take 1,000 mcg by mouth daily.            Durable Medical Equipment         Start     Ordered   02/01/17 [phone]  For home use only DME Ventilator  Once    Comments:  Triology vent   02/01/17 0938   01/31/17 1517  For home use only DME Bipap  Once    Comments:  IPAP 14, EPAP 6  Question Answer Comment  Bleed in oxygen (LPM) 2-6 L   Keep 02 saturation > 88%   Inspiratory pressure OTHER SEE COMMENTS   Expiratory pressure OTHER SEE COMMENTS      01/31/17 1518   01/31/17 1114  For home use only DME oxygen  Once    Question Answer Comment  Mode or (Route) Nasal cannula   Liters per Minute 6   Frequency Continuous (stationary and portable oxygen unit needed)   Oxygen delivery system Gas  01/31/17 1113     Follow-up Information    Tammy Parrett, NP Follow up on 02/09/2017.   Specialty:  Pulmonary Disease Why:  Appt at 3:00 PM Contact information: [address] [place] [address] [phone]        Coralyn Helling, MD Follow up on 04/12/2017.   Specialty:  Pulmonary Disease Why:  Appt at 11:45 AM to follow up for sleep study evaluation Contact information: [address] [place] [address] [phone]        [facility]. Schedule an appointment as soon as possible for a visit in 1 week(s).   Why:  Establish with a primary care physician Contact information: [address] [place] [address] [phone]         Allergies  Allergen Reactions  . Advil [Ibuprofen] Hives and Itching    Consultations:  PCCM   Procedures/Studies: Ct Angio Chest Pe W Or Wo Contrast  Result Date: 01/28/2017 CLINICAL DATA:  Sudden onset of shortness of breath. EXAM: CT ANGIOGRAPHY CHEST WITH CONTRAST TECHNIQUE: Multidetector CT imaging of the chest was performed using the standard protocol during bolus administration of intravenous contrast. Multiplanar CT image reconstructions and MIPs were obtained to evaluate the vascular anatomy. CONTRAST:  100 mL of Isovue 370 COMPARISON:  Chest x-ray  from earlier today FINDINGS: Cardiovascular: The thoracic aorta is normal in caliber with no aneurysm or dissection. Mild cardiomegaly. Respiratory motion limits evaluation of the pulmonary arteries. Within this limitation, no definitive pulmonary emboli identified. Mediastinum/Nodes: There several collateral vessels in the anterior chest wall of uncertain etiology or significance. Lymph nodes in the bilateral axilla and base of neck are normal. There is a mildly enlarged node in the precarinal region on image 34 measuring 15 mm. A few other shotty nodes are seen in the mediastinum and hila of but no other grossly enlarged nodes are noted. The thyroid and esophagus are normal in appearance. No effusions. Lungs/Pleura: The trachea and mainstem bronchi are normal. Suggested mild bronchial wall thickening. No pneumothorax. Subsegmental atelectasis in the bases, lingula, and right middle lobe. There is a peripheral nodule in the right upper lobe on series 10, image 38 measuring up 8 mm. No other suspicious nodules. No masses. No focal infiltrate. No overt edema. Upper Abdomen: No acute abnormality. Musculoskeletal: No chest wall abnormality. No acute or significant osseous findings. Review of the MIP images confirms the above findings. IMPRESSION: 1. Respiratory motion limits evaluation for pulmonary emboli but no central emboli are identified. 2. Mild cardiomegaly. 3. 8 mm nodule in the periphery of the right upper lobe. This is of low suspicion in a patient of this age unless the patient has a history of malignancy. Non-contrast chest CT at 6-12 months is recommended. If the nodule is stable at time of repeat CT, then future CT at 18-24 months (from today's scan) is considered optional for low-risk patients, but is recommended for high-risk patients. This recommendation follows the consensus statement: Guidelines for Management of Incidental Pulmonary Nodules Detected on CT Images: From the Fleischner Society 2017;  Radiology 2017; 284:228-243. 4. Mild bronchial thickening may be seen with bronchitis. 5. A mildly enlarged precarinal node is nonspecific but may be reactive. Recommend follow-up as clinically warranted. Electronically Signed   By: Gerome Sam III M.D   On: 01/28/2017 20:48   Dg Chest Port 1 View  Result Date: 01/28/2017 CLINICAL DATA:  Shortness of breath and wheezing EXAM: PORTABLE CHEST 1 VIEW COMPARISON:  January 28, 2017 FINDINGS: Stable cardiomegaly. No pneumothorax. No pulmonary nodules, masses, or focal infiltrates. No overt edema. IMPRESSION: Cardiomegaly.  No acute abnormality. Electronically Signed   By: Gerome Sam III M.D   On: 01/28/2017 15:32   Dg Chest Portable 1 View  Result Date: 01/27/2017 CLINICAL DATA:  Dyspnea, fever and wheeze for the past several hours EXAM: PORTABLE CHEST 1 VIEW COMPARISON:  None. FINDINGS: Portable semi upright view of the chest demonstrates borderline cardiomegaly. No aortic aneurysm. No pneumonic consolidation, effusion or pneumothorax. No acute nor suspicious osseous appearing abnormalities. IMPRESSION: No active disease. Electronically Signed   By: Tollie Eth M.D.   On: 01/27/2017 22:53    Echo  Study Conclusions  - Left ventricle: The cavity size was normal. There was mild   concentric hypertrophy. Systolic function was vigorous. The   estimated ejection fraction was in the range of 65% to 70%. Wall   motion was normal; there were no regional wall motion   abnormalities. Left ventricular diastolic function parameters   were normal. - Aortic valve: Trileaflet; normal thickness leaflets. There was no   regurgitation. - Aortic root: The aortic root was normal in size. - Left atrium: The atrium was mildly dilated. - Right ventricle: The cavity size was mildly dilated. Wall   thickness was normal. Systolic function was normal. - Tricuspid valve: There was mild regurgitation. - Pulmonic valve: There was no regurgitation. - Pulmonary  arteries: Systolic pressure was moderately increased.   PA peak pressure: 46 mm Hg (S). - Inferior vena cava: The vessel was dilated. The respirophasic   diameter changes were blunted (< 50%), consistent with elevated   central venous pressure.  Impressions:  - There is normal LV systolic and diastolic dysfunction.   RV is mildly dilated with moderate pulmonary hypertension.   Consider pulmonary embolsim.   Discharge Exam: Vitals:   01/31/17 1427 02/01/17 0615  BP: 140/81 136/81  Pulse: 90 88  Resp: 20 19  Temp: 98.2 F (36.8 C) 98.1 F (36.7 C)   Vitals:   01/31/17 0834 01/31/17 1427 01/31/17 1915 02/01/17 0615  BP:  140/81  136/81  Pulse:  90  88  Resp:  20  19  Temp:  98.2 F (36.8 C)  98.1 F (36.7 C)  TempSrc:  Oral  Oral  SpO2: 94% 96% 96% 95%  Weight:      Height:         General: Pt is alert, awake, not in acute distress Cardiovascular: RRR, S1/S2 +, no rubs, no gallops Respiratory: CTA bilaterally, minimal expiratory wheezing, no rhonchi, not in distress, on [place] O2  Abdominal: Soft, NT, ND, bowel sounds + Extremities: no edema, no cyanosis    The results of significant diagnostics from this hospitalization (including imaging, microbiology, ancillary and laboratory) are listed below for reference.     Microbiology: Recent Results (from the past 240 hour(s))  Respiratory Panel by PCR     Status: Abnormal   Collection Time: 01/28/17  1:05 PM  Result Value Ref Range Status   Adenovirus NOT DETECTED NOT DETECTED Final   Coronavirus 229E NOT DETECTED NOT DETECTED Final   Coronavirus HKU1 NOT DETECTED NOT DETECTED Final   Coronavirus NL63 NOT DETECTED NOT DETECTED Final   Coronavirus OC43 NOT DETECTED NOT DETECTED Final   Metapneumovirus NOT DETECTED NOT DETECTED Final   Rhinovirus / Enterovirus DETECTED (A) NOT DETECTED Final   Influenza A NOT DETECTED NOT DETECTED Final   Influenza B NOT DETECTED NOT DETECTED  Final   Parainfluenza Virus 1 NOT  DETECTED NOT DETECTED Final   Parainfluenza Virus 2 NOT DETECTED NOT DETECTED Final   Parainfluenza Virus 3 NOT DETECTED NOT DETECTED Final   Parainfluenza Virus 4 NOT DETECTED NOT DETECTED Final   Respiratory Syncytial Virus NOT DETECTED NOT DETECTED Final   Bordetella pertussis NOT DETECTED NOT DETECTED Final   Chlamydophila pneumoniae NOT DETECTED NOT DETECTED Final   Mycoplasma pneumoniae NOT DETECTED NOT DETECTED Final  Culture, expectorated sputum-assessment     Status: None   Collection Time: 01/28/17  1:11 PM  Result Value Ref Range Status   Specimen Description SPU  Final   Special Requests NONE  Final   Sputum evaluation THIS SPECIMEN IS ACCEPTABLE FOR SPUTUM CULTURE  Final   Report Status 01/28/2017 FINAL  Final  Culture, respiratory (NON-Expectorated)     Status: None   Collection Time: 01/28/17  1:11 PM  Result Value Ref Range Status   Specimen Description SPU  Final   Special Requests NONE Reflexed from [id number]  Final   Gram Stain   Final    ABUNDANT WBC PRESENT,BOTH PMN AND MONONUCLEAR MODERATE GRAM NEGATIVE RODS FEW GRAM NEGATIVE COCCI IN PAIRS    Culture   Final    Consistent with normal respiratory flora. Performed at [facility], [address], [place], [place] [address]    Report Status 01/31/2017 FINAL  Final  MRSA PCR Screening     Status: None   Collection Time: 01/28/17  3:05 PM  Result Value Ref Range Status   MRSA by PCR NEGATIVE NEGATIVE Final    Comment:        The GeneXpert MRSA Assay (FDA approved for NASAL specimens only), is one component of a comprehensive MRSA colonization surveillance program. It is not intended to diagnose MRSA infection nor to guide or monitor treatment for MRSA infections.      Labs: BNP (last 3 results)  Recent Labs  01/28/17 1630  BNP 389.4*   Basic Metabolic Panel:  Recent Labs Lab 01/27/17 2220 01/28/17 0541 01/29/17 0351 01/30/17 0401  NA 136 139  --  139  K 3.2* 4.5  --  4.9  CL 94*  99*  --  97*  CO2 32 30  --  34*  GLUCOSE 123* 170*  --  146*  BUN 13 11  --  25*  CREATININE 0.95 0.85  --  0.86  CALCIUM 9.1 8.9  --  9.3  MG  --   --  2.0 2.2  PHOS  --   --  3.8 3.4   Liver Function Tests: No results for input(s): AST, ALT, ALKPHOS, BILITOT, PROT, ALBUMIN in the last 168 hours. No results for input(s): LIPASE, AMYLASE in the last 168 hours. No results for input(s): AMMONIA in the last 168 hours. CBC:  Recent Labs Lab 01/27/17 2220 01/28/17 0541 01/29/17 0351 01/30/17 0401 01/31/17 0649  WBC 14.6* 12.3* 21.5* 16.8* 15.2*  NEUTROABS  --   --  20.0* 15.5*  --   HGB 15.0 15.6* 14.1 14.1 13.9  HCT 49.0* 52.9* 50.0* 50.5* 49.4*  MCV 95.5 97.2 100.6* 101.4* 100.8*  PLT 257 197 241 234 237   Cardiac Enzymes:  Recent Labs Lab 01/28/17 1630 01/28/17 2230 01/29/17 0351  TROPONINI 0.03* 0.03* <0.03   BNP: Invalid input(s): POCBNP CBG:  Recent Labs Lab 01/31/17 0712 01/31/17 1213 01/31/17 1658 01/31/17 2103 02/01/17 0723  GLUCAP 91 141* 226* 233* 97   D-Dimer No results for  input(s): DDIMER in the last 72 hours. Hgb A1c  Recent Labs  01/30/17 0401  HGBA1C 5.9*   Lipid Profile No results for input(s): CHOL, HDL, LDLCALC, TRIG, CHOLHDL, LDLDIRECT in the last 72 hours. Thyroid function studies No results for input(s): TSH, T4TOTAL, T3FREE, THYROIDAB in the last 72 hours.  Invalid input(s): FREET3 Anemia work up No results for input(s): VITAMINB12, FOLATE, FERRITIN, TIBC, IRON, RETICCTPCT in the last 72 hours. Urinalysis    Component Value Date/Time   COLORURINE YELLOW 10/08/2009 1824   APPEARANCEUR CLEAR 10/08/2009 1824   LABSPEC 1.013 10/08/2009 1824   PHURINE 6.5 10/08/2009 1824   GLUCOSEU NEGATIVE 10/08/2009 1824   HGBUR LARGE (A) 10/08/2009 1824   BILIRUBINUR NEGATIVE 10/08/2009 1824   KETONESUR NEGATIVE 10/08/2009 1824   PROTEINUR NEGATIVE 10/08/2009 1824   UROBILINOGEN 1.0 10/08/2009 1824   NITRITE NEGATIVE 10/08/2009 1824    LEUKOCYTESUR SMALL (A) 10/08/2009 1824   Sepsis Labs Invalid input(s): PROCALCITONIN,  WBC,  LACTICIDVEN Microbiology Recent Results (from the past 240 hour(s))  Respiratory Panel by PCR     Status: Abnormal   Collection Time: 01/28/17  1:05 PM  Result Value Ref Range Status   Adenovirus NOT DETECTED NOT DETECTED Final   Coronavirus 229E NOT DETECTED NOT DETECTED Final   Coronavirus HKU1 NOT DETECTED NOT DETECTED Final   Coronavirus NL63 NOT DETECTED NOT DETECTED Final   Coronavirus OC43 NOT DETECTED NOT DETECTED Final   Metapneumovirus NOT DETECTED NOT DETECTED Final   Rhinovirus / Enterovirus DETECTED (A) NOT DETECTED Final   Influenza A NOT DETECTED NOT DETECTED Final   Influenza B NOT DETECTED NOT DETECTED Final   Parainfluenza Virus 1 NOT DETECTED NOT DETECTED Final   Parainfluenza Virus 2 NOT DETECTED NOT DETECTED Final   Parainfluenza Virus 3 NOT DETECTED NOT DETECTED Final   Parainfluenza Virus 4 NOT DETECTED NOT DETECTED Final   Respiratory Syncytial Virus NOT DETECTED NOT DETECTED Final   Bordetella pertussis NOT DETECTED NOT DETECTED Final   Chlamydophila pneumoniae NOT DETECTED NOT DETECTED Final   Mycoplasma pneumoniae NOT DETECTED NOT DETECTED Final  Culture, expectorated sputum-assessment     Status: None   Collection Time: 01/28/17  1:11 PM  Result Value Ref Range Status   Specimen Description SPU  Final   Special Requests NONE  Final   Sputum evaluation THIS SPECIMEN IS ACCEPTABLE FOR SPUTUM CULTURE  Final   Report Status 01/28/2017 FINAL  Final  Culture, respiratory (NON-Expectorated)     Status: None   Collection Time: 01/28/17  1:11 PM  Result Value Ref Range Status   Specimen Description SPU  Final   Special Requests NONE Reflexed from [id number]  Final   Gram Stain   Final    ABUNDANT WBC PRESENT,BOTH PMN AND MONONUCLEAR MODERATE GRAM NEGATIVE RODS FEW GRAM NEGATIVE COCCI IN PAIRS    Culture   Final    Consistent with normal respiratory flora. Performed  at [facility], [address] [place], [place] [address]    Report Status 01/31/2017 FINAL  Final  MRSA PCR Screening     Status: None   Collection Time: 01/28/17  3:05 PM  Result Value Ref Range Status   MRSA by PCR NEGATIVE NEGATIVE Final    Comment:        The GeneXpert MRSA Assay (FDA approved for NASAL specimens only), is one component of a comprehensive MRSA colonization surveillance program. It is not intended to diagnose MRSA infection nor to guide or monitor treatment for  MRSA infections.      Time coordinating discharge: 40 minutes  SIGNED:  Noralee Stain, DO [facility] Pager [phone]  If 7PM-7AM, please contact night-coverage [email or web address] Password [email or web address] 02/01/2017, 11:21 AM

## 2017-01-31 NOTE — Progress Notes (Signed)
PROGRESS NOTE    Elizabeth Campbell  ZHY:[id number] DOB: 07/31/1981 DOA: 01/27/2017 PCP: No PCP Per Patient     Brief Narrative:  Elizabeth Campbell is a 36 yo female with history of tobacco abuse presents with a year with complaints of shortness of breath and wheezing since day prior to admission. She was admitted with acute hypoxemic respiratory failure due to acute bronchitis. After admission, she continued to deteriorate despite high flow [place] O2 and was transferred to step down unit for bipap.   Assessment & Plan:   Principal Problem:   Acute bronchitis Active Problems:   Acute respiratory failure (HCC)   Respiratory failure with hypoxia (HCC)   Acute hypoxemic and hypercapnic respiratory failure -Multifactorial, likely underlying undiagnosed lung disease such as asthma, OSA, as well as acute bronchitis, rhinovirus, seasonal allergies -CT chest without PE -Now off bipap -Sputum culture normal flora  -Echocardiogram with normal systolic/diastolic function, did show moderate pulmonary hypertension  -Appreciate PCCM -Will need PFT and sleep study as outpatient  -Continue azithromax, prednisone taper,flonase + nasal saline, pulmicort + albuterol  -Will need home O2 and bipap. Was going to discharge home today, but bipap is not approved. We will have to address this in the morning.   Leukocytosis -In setting of steroids -Trend   Rash -Benadryl, pepcid, zyrtec   8 mm nodule RUL lung nodule -Need outpatient follow up CT in 3 months  Tobacco use -Cessation counseling    DVT prophylaxis: subq hep Code Status: Full Family Communication: spoke with mother over the phone   Disposition Plan: home in the morning, once bipap figured out    Consultants:   PCCM  Procedures:   None  Antimicrobials:   Azithromax     Subjective: No complaints today. Wants to go home.    Objective: Vitals:   01/31/17 0420 01/31/17 0830 01/31/17 0834 01/31/17 1427  BP: (!) 148/65    140/81  Pulse: (!) 103   90  Resp: 20   20  Temp: 98.1 F (36.7 C)   98.2 F (36.8 C)  TempSrc: Oral   Oral  SpO2: 95% 94% 94% 96%  Weight:      Height:        Intake/Output Summary (Last 24 hours) at 01/31/17 1738 Last data filed at 01/31/17 1428  Gross per 24 hour  Intake             1080 ml  Output                0 ml  Net             1080 ml   Filed Weights   01/28/17 0100 01/28/17 1500  Weight: (!) 147.1 kg (324 lb 4.8 oz) (!) 146.5 kg (322 lb 15.6 oz)    Examination:  General exam: Appears calm and comfortable, sitting in bed  Respiratory system: Diminished breath sounds, clear otherwise. Respiratory effort normal. On [place] O2  Cardiovascular system: S1 & S2 heard, RRR. No JVD, murmurs, rubs, gallops or clicks. No pedal edema. Gastrointestinal system: Abdomen is nondistended, soft and nontender. No organomegaly or masses felt. Normal bowel sounds heard. Central nervous system: Alert and oriented. No focal neurological deficits. Extremities: Symmetric  Skin: Rash has improved Psychiatry: Judgement and insight appear normal. Mood & affect appropriate.   Data Reviewed: I have personally reviewed following labs and imaging studies  CBC:  Recent Labs Lab 01/27/17 2220 01/28/17 0541 01/29/17 0351 01/30/17 0401 01/31/17 0649  WBC 14.6*  12.3* 21.5* 16.8* 15.2*  NEUTROABS  --   --  20.0* 15.5*  --   HGB 15.0 15.6* 14.1 14.1 13.9  HCT 49.0* 52.9* 50.0* 50.5* 49.4*  MCV 95.5 97.2 100.6* 101.4* 100.8*  PLT 257 197 241 234 237   Basic Metabolic Panel:  Recent Labs Lab 01/27/17 2220 01/28/17 0541 01/29/17 0351 01/30/17 0401  NA 136 139  --  139  K 3.2* 4.5  --  4.9  CL 94* 99*  --  97*  CO2 32 30  --  34*  GLUCOSE 123* 170*  --  146*  BUN 13 11  --  25*  CREATININE 0.95 0.85  --  0.86  CALCIUM 9.1 8.9  --  9.3  MG  --   --  2.0 2.2  PHOS  --   --  3.8 3.4   GFR: Estimated Creatinine Clearance: 145.1 mL/min (by C-G formula based on SCr of 0.86  mg/dL). Liver Function Tests: No results for input(s): AST, ALT, ALKPHOS, BILITOT, PROT, ALBUMIN in the last 168 hours. No results for input(s): LIPASE, AMYLASE in the last 168 hours. No results for input(s): AMMONIA in the last 168 hours. Coagulation Profile:  Recent Labs Lab 01/28/17 1817  INR 1.14   Cardiac Enzymes:  Recent Labs Lab 01/28/17 1630 01/28/17 2230 01/29/17 0351  TROPONINI 0.03* 0.03* <0.03   BNP (last 3 results) No results for input(s): PROBNP in the last 8760 hours. HbA1C:  Recent Labs  01/30/17 0401  HGBA1C 5.9*   CBG:  Recent Labs Lab 01/30/17 2137 01/31/17 0632 01/31/17 0712 01/31/17 1213 01/31/17 1658  GLUCAP 221* 99 91 141* 226*   Lipid Profile: No results for input(s): CHOL, HDL, LDLCALC, TRIG, CHOLHDL, LDLDIRECT in the last 72 hours. Thyroid Function Tests: No results for input(s): TSH, T4TOTAL, FREET4, T3FREE, THYROIDAB in the last 72 hours. Anemia Panel: No results for input(s): VITAMINB12, FOLATE, FERRITIN, TIBC, IRON, RETICCTPCT in the last 72 hours. Sepsis Labs:  Recent Labs Lab 01/28/17 1630 01/28/17 1817 01/29/17 0351 01/30/17 0401  PROCALCITON 0.45  --  0.40 0.25  LATICACIDVEN  --  2.3* 0.9  --     Recent Results (from the past 240 hour(s))  Respiratory Panel by PCR     Status: Abnormal   Collection Time: 01/28/17  1:05 PM  Result Value Ref Range Status   Adenovirus NOT DETECTED NOT DETECTED Final   Coronavirus 229E NOT DETECTED NOT DETECTED Final   Coronavirus HKU1 NOT DETECTED NOT DETECTED Final   Coronavirus NL63 NOT DETECTED NOT DETECTED Final   Coronavirus OC43 NOT DETECTED NOT DETECTED Final   Metapneumovirus NOT DETECTED NOT DETECTED Final   Rhinovirus / Enterovirus DETECTED (A) NOT DETECTED Final   Influenza A NOT DETECTED NOT DETECTED Final   Influenza B NOT DETECTED NOT DETECTED Final   Parainfluenza Virus 1 NOT DETECTED NOT DETECTED Final   Parainfluenza Virus 2 NOT DETECTED NOT DETECTED Final    Parainfluenza Virus 3 NOT DETECTED NOT DETECTED Final   Parainfluenza Virus 4 NOT DETECTED NOT DETECTED Final   Respiratory Syncytial Virus NOT DETECTED NOT DETECTED Final   Bordetella pertussis NOT DETECTED NOT DETECTED Final   Chlamydophila pneumoniae NOT DETECTED NOT DETECTED Final   Mycoplasma pneumoniae NOT DETECTED NOT DETECTED Final  Culture, expectorated sputum-assessment     Status: None   Collection Time: 01/28/17  1:11 PM  Result Value Ref Range Status   Specimen Description SPU  Final   Special Requests NONE  Final  Sputum evaluation THIS SPECIMEN IS ACCEPTABLE FOR SPUTUM CULTURE  Final   Report Status 01/28/2017 FINAL  Final  Culture, respiratory (NON-Expectorated)     Status: None   Collection Time: 01/28/17  1:11 PM  Result Value Ref Range Status   Specimen Description SPU  Final   Special Requests NONE Reflexed from [id number]  Final   Gram Stain   Final    ABUNDANT WBC PRESENT,BOTH PMN AND MONONUCLEAR MODERATE GRAM NEGATIVE RODS FEW GRAM NEGATIVE COCCI IN PAIRS    Culture   Final    Consistent with normal respiratory flora. Performed at [facility], [address], [place], [place] [address]    Report Status 01/31/2017 FINAL  Final  MRSA PCR Screening     Status: None   Collection Time: 01/28/17  3:05 PM  Result Value Ref Range Status   MRSA by PCR NEGATIVE NEGATIVE Final    Comment:        The GeneXpert MRSA Assay (FDA approved for NASAL specimens only), is one component of a comprehensive MRSA colonization surveillance program. It is not intended to diagnose MRSA infection nor to guide or monitor treatment for MRSA infections.        Radiology Studies: No results found.    Scheduled Meds: . albuterol  2.5 mg Nebulization QID  . azithromycin  500 mg Oral QHS  . budesonide (PULMICORT) nebulizer solution  0.5 mg Nebulization BID  . diphenhydrAMINE  25 mg Oral Daily  . famotidine  20 mg Oral BID  . fluticasone  2 spray Each Nare Daily  .  heparin subcutaneous  5,000 Units Subcutaneous Q8H  . insulin aspart  0-15 Units Subcutaneous TID WC  . insulin aspart  0-5 Units Subcutaneous QHS  . loratadine  10 mg Oral Daily  . mouth rinse  15 mL Mouth Rinse BID  . [START ON 02/01/2017] predniSONE  40 mg Oral Q breakfast  . sodium chloride  2 spray Each Nare BID   Continuous Infusions:   LOS: 3 days    Time spent: 30 minutes   Noralee Stain, DO [facility] [email or web address] Password [facility] 01/31/2017, 5:38 PM

## 2017-01-31 NOTE — Progress Notes (Deleted)
Patient given discharge instructions, and verbalized an understanding of all discharge instructions.  Patient agrees with discharge plan, and is being discharged in stable medical condition.  Patient has received home oxygen to be transported home. Patient will begiven transportation via wheelchair.

## 2017-01-31 NOTE — Progress Notes (Signed)
                      durable medical equipment  Home 02 and Bipap called to [email or web address]

## 2017-01-31 NOTE — Progress Notes (Signed)
[place] [facility] Note  Presenting HPI:  36 y.o. Female with history of tobacco use admitted with 3-4 days of sinus congestion and postnasal drainage followed by one day of wheezing with associated shortness of breath. No sick contacts. No chest pain. Patient was found to be hypoxic with saturations in the 70s. Patient was saturating 82% on room air at rest and decreased to 66% on room air with ambulation. She was noted to have a diffuse rash on both arms and legs approximately 3-4 days prior after doing yard work. Rash subsided with Benadryl. Patient still be saturating 87% on 6 L/m. Rapid response was initiated and patient was subsequently transitioned to the intensive care unit with noninvasive positive pressure ventilation. Critical care was contacted to assist with evaluation.  Subjective:  Pt reports ongoing rash and intermittent difficulty (tightness) with breathing.  Reports rash itches.    Temp:  [98 F (36.7 C)-98.1 F (36.7 C)] 98.1 F (36.7 C) (04/30 0420) Pulse Rate:  [76-107] 103 (04/30 0420) Resp:  [18-20] 20 (04/30 0420) BP: (126-158)/(64-93) 148/65 (04/30 0420) SpO2:  [92 %-96 %] 94 % (04/30 0834)   General:  Obese female in NAD, sitting up in bed  HEENT: MM pink/moist, good dentition  PSY: calm/appropriate Neuro: AAOx4, speech clear, MAE CV: s1s2 rrr, no m/r/g PULM: even/non-labored, lungs bilaterally with scattered wheeze  HY:QMVH, non-tender, bsx4 active  Extremities: warm/dry, no edema  Skin: scattered erythematous rash, mildly raised areas       CBC Latest Ref Rng & Units 01/31/2017 01/30/2017 01/29/2017  WBC 4.0 - 10.5 K/uL 15.2(H) 16.8(H) 21.5(H)  Hemoglobin 12.0 - 15.0 g/dL 84.6 96.2 95.2  Hematocrit 36.0 - 46.0 % 49.4(H) 50.5(H) 50.0(H)  Platelets 150 - 400 K/uL 237 234 241   BMP Latest Ref Rng & Units 01/30/2017 01/28/2017 01/27/2017  Glucose 65 - 99 mg/dL 841(L) 244(W) 102(V)  BUN 6 - 20 mg/dL 25(D) 11 13  Creatinine 0.44 - 1.00 mg/dL 6.64  4.03 4.74  Sodium 135 - 145 mmol/L 139 139 136  Potassium 3.5 - 5.1 mmol/L 4.9 4.5 3.2(L)  Chloride 101 - 111 mmol/L 97(L) 99(L) 94(L)  CO2 22 - 32 mmol/L 34(H) 30 32  Calcium 8.9 - 10.3 mg/dL 9.3 8.9 9.1    IMAGING/STUDIES: CTA CHEST 4/27:  Personally reviewed by me. Imaging somewhat degraded by motion. No central pulmonary embolism. Precarinal enlarged lymph node noted. No other pathologically enlarged mediastinal adenopathy.No pleural effusion or thickening. No pericardial effusion. Some dependent atelectasis is noted. Questionable small cystic changes within the lungs bilaterally. Bronchial wall thickening noted.Radiology measured an 8 mm nodule in the periphery of the right upper lobe but on my review this appeared to be only 6 mm. TTE 4/29:  Normal LV systolic/diastolic dysfunction, RV mildly dilated with moderate pulmonary hypertension (PA peak pressure 46 mm Hg)  MICROBIOLOGY: MRSA PCR 4/27:  Negative HIV 4/27:  Nonreactive Sputum Culture 4/27 >> normal flora  Respiratory Viral Panel PCR 4/27:  Rhinovirus  ANTIBIOTICS: Azithromycin 4/27 >>  ASSESSMENT/PLAN:  36 y.o. Female presenting with acute upper respiratory infection, rhinovirus positive. Work up ruled out PE but does reveal she has pulmonary hypertension and an 8 mm RUL pulmonary nodule.  Concern for inaccurate readings on SpO2, in addition she has had significant congestion making intranasal O2 use somewhat limited.    Acute Hypoxic Respiratory Failure - assess ABG to review O2 saturation as well as to rule out resting hypercarbia / qualify for home BiPAP  - wean  O2 for sats > 92% - repeat walking assessment of O2 needs with forehead pulse oximetry  - ECHO as above    Acute upper respiratory infection/acute bronchitis - sputum culture negative Rhinovirus Positive  - continue azithromycin for now, D4/x  - continue Afrin in the short term - nasal hygiene > flonase + nasal saline  - continue pulmicort + albuterol  -  reduce prednisone to 40 mg QD, taper prednisone to off over 7-10 days - Pulmonary follow up arranged > see discharge section  Rash - viral exanthem? 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 I have discussed the case with B Ollis, and I agree with the data and plans as amended above.   36 year old woman, history tobacco, apparent allergic rhinitis. Admitted with an interesting combination of symptoms that included, upper respiratory, viral type symptoms, acute on chronic dyspnea with hypoxemia, urticarial rash on her upper and lower extremities. She has wheezing on exam consistent with obstructive lung disease. She also has sleeping and waking hypoxemia, body habitus suggestive of obesity hypoventilation syndrome/obstructive sleep apnea. I believe that she is experiencing acute on chronic hypoxemia in the setting of acute broncho-spasm superimposed on chronic hypoventilation. She likely needs oxygen at baseline. 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 I agree with her current steroids,  bronchodilators. She needs outpatient pulmonary function testing to evaluate for underlying asthma. Suspect that she will need an inhaled corticosteroid for maintenance until this can be done. Based on her rash she likely needs a formal allergy evaluation to assess the relationship between her urticaria and presumed asthma. Clearly smoking cessation will be important as well. We will arrange for her to follow-up with [place] in pulmonary clinic as an outpatient. This will include a full sleep evaluation. In the meantime she would benefit most from positive pressure ventilation at night. We can perform her titration study as an outpt.   Levy Pupa, MD, PhD 02/01/2017, 10:44 AM [facility] [phone] or if no answer [phone]

## 2017-02-01 DIAGNOSIS — E662 Morbid (severe) obesity with alveolar hypoventilation: Secondary | ICD-10-CM

## 2017-02-01 DIAGNOSIS — J4541 Moderate persistent asthma with (acute) exacerbation: Secondary | ICD-10-CM

## 2017-02-01 DIAGNOSIS — G4733 Obstructive sleep apnea (adult) (pediatric): Secondary | ICD-10-CM

## 2017-02-01 DIAGNOSIS — J9621 Acute and chronic respiratory failure with hypoxia: Secondary | ICD-10-CM

## 2017-02-01 LAB — GLUCOSE, CAPILLARY
GLUCOSE-CAPILLARY: 97 mg/dL (ref 65–99)
GLUCOSE-CAPILLARY: 157 mg/dL — AB (ref 65–99)

## 2017-02-01 NOTE — Care Management Note (Signed)
Case Management Note  Patient Details  Name: Elizabeth Campbell MRN: [id number] Date of Birth: [birth year]  Subjective/Objective:       resp failure             Action/Plan: Date:  Feb 01, 2017 Chart reviewed for concurrent status and case management needs. Will continue to follow patient progress. Discharge Planning: following for needs Expected discharge date: [id number] Marcelle Smiling, BSN, [place], [place]   [phone]  Expected Discharge Date:  01/31/17               Expected Discharge Plan:  Home/Self Care  In-House Referral:     Discharge planning Services     Post Acute Care Choice:    Choice offered to:     DME Arranged:    DME Agency:     HH Arranged:    HH Agency:     Status of Service:  In process, will continue to follow  If discussed at Long Length of Stay Meetings, dates discussed:    Additional Comments:  Golda Acre, RN 02/01/2017, 8:34 AM

## 2017-02-02 NOTE — ED Provider Notes (Signed)
[facility]-EMERGENCY DEPT Provider Note   CSN: [id number] Arrival date & time: 01/27/17  2138     History   Chief Complaint Chief Complaint  Patient presents with  . Shortness of Breath    HPI Elizabeth Campbell is a 36 y.o. female.  HPI    36 y.o. female with history of tobacco abuse presents with a year with complaints of shortness of breath and wheezing since yesterday morning. Denies any chest pain fever or chills. Denies any recent travel or sick contacts. No unusual leg pain or swelling.   History reviewed. No pertinent past medical history.  Patient Active Problem List   Diagnosis Date Noted  . Obesity hypoventilation syndrome (HCC)   . Moderate persistent asthma with acute exacerbation   . Acute bronchitis 01/28/2017  . Acute respiratory failure (HCC)   . Respiratory failure with hypoxia [facility])     Past Surgical History:  Procedure Laterality Date  . LAPAROSCOPIC TUBAL LIGATION    . TONSILLECTOMY    . TUBAL LIGATION      OB History    No data available       Home Medications    Prior to Admission medications   Medication Sig Start Date End Date Taking? Authorizing Provider  Multiple Vitamin (MULTIVITAMIN WITH MINERALS) TABS tablet Take 1 tablet by mouth daily.   Yes Historical Provider, MD  OVER THE COUNTER MEDICATION Take 1 tablet by mouth daily.   Yes Historical Provider, MD  vitamin B-12 (CYANOCOBALAMIN) 1000 MCG tablet Take 1,000 mcg by mouth daily.   Yes Historical Provider, MD  albuterol (PROVENTIL HFA;VENTOLIN HFA) 108 (90 Base) MCG/ACT inhaler Inhale 2 puffs into the lungs every 6 (six) hours as needed for wheezing or shortness of breath. 01/31/17   Jennifer Chahn-Yang Choi, DO  Budesonide (PULMICORT FLEXHALER) 90 MCG/ACT inhaler Inhale 1 puff into the lungs 2 (two) times daily. 01/31/17   Jordan Hawks, DO  Cetirizine HCl (ZYRTEC ALLERGY) 10 MG CAPS Take 1 capsule (10 mg total) by mouth daily. 01/31/17   Carlton Adam Choi, DO    diphenhydrAMINE (BENADRYL) 25 mg capsule Take 1 capsule (25 mg total) by mouth daily. 02/01/17   Jennifer Chahn-Yang Choi, DO  famotidine (PEPCID) 20 MG tablet Take 1 tablet (20 mg total) by mouth 2 (two) times daily. 01/31/17   Jennifer Chahn-Yang Choi, DO  fluticasone (FLONASE) 50 MCG/ACT nasal spray Place 2 sprays into both nostrils daily. 01/31/17   Jennifer Chahn-Yang Choi, DO  predniSONE (DELTASONE) 10 MG tablet Take 4 tabs for 1 day, then 3 tabs for 3 days, then 2 tabs for 3 days, then 1 tab for 3 days 01/31/17   Jordan Hawks, DO  sodium chloride (OCEAN) 0.65 % SOLN nasal spray Place 2 sprays into both nostrils 2 (two) times daily. 01/31/17   Jordan Hawks, DO    Family History Family History  Problem Relation Age of Onset  . Hypertension Mother     Social History Social History  Substance Use Topics  . Smoking status: Current Every Day Smoker    Packs/day: 1.00    Years: 15.00    Types: Cigarettes  . Smokeless tobacco: Never Used  . Alcohol use No     Allergies   Advil [ibuprofen]   Review of Systems Review of Systems  All systems reviewed and negative, other than as noted in HPI.   Physical Exam Updated Vital Signs BP 136/81 (BP Location: Left Arm)   Pulse 88   Temp 98.1  F (36.7 C) (Oral)   Resp 19   Ht  (1.778 m)   Wt (!) 322 lb 15.6 oz (146.5 kg)   LMP 01/27/2017   SpO2 95%   BMI 46.34 kg/m   Physical Exam  Constitutional: She appears well-developed and well-nourished. No distress.  HENT:  Head: Normocephalic and atraumatic.  Eyes: Conjunctivae are normal. Right eye exhibits no discharge. Left eye exhibits no discharge.  Neck: Neck supple.  Cardiovascular: Normal rate, regular rhythm and normal heart sounds.  Exam reveals no gallop and no friction rub.   No murmur heard. Pulmonary/Chest: Effort normal. No respiratory distress. She has wheezes.  Abdominal: Soft. She exhibits no distension. There is no tenderness.   Musculoskeletal: She exhibits no edema or tenderness.  Lower extremities symmetric as compared to each other. No calf tenderness. Negative Homan's. No palpable cords.   Neurological: She is alert.  Skin: Skin is warm and dry.  Psychiatric: She has a normal mood and affect. Her behavior is normal. Thought content normal.  Nursing note and vitals reviewed.    ED Treatments / Results  Labs (all labs ordered are listed, but only abnormal results are displayed) Labs Reviewed  RESPIRATORY PANEL BY PCR - Abnormal; Notable for the following:       Result Value   Rhinovirus / Enterovirus DETECTED (*)    All other components within normal limits  CBC - Abnormal; Notable for the following:    WBC 14.6 (*)    RBC 5.13 (*)    HCT 49.0 (*)    RDW 20.8 (*)    All other components within normal limits  BASIC METABOLIC PANEL - Abnormal; Notable for the following:    Potassium 3.2 (*)    Chloride 94 (*)    Glucose, Bld 123 (*)    All other components within normal limits  BASIC METABOLIC PANEL - Abnormal; Notable for the following:    Chloride 99 (*)    Glucose, Bld 170 (*)    All other components within normal limits  CBC - Abnormal; Notable for the following:    WBC 12.3 (*)    RBC 5.44 (*)    Hemoglobin 15.6 (*)    HCT 52.9 (*)    MCHC 29.5 (*)    RDW 20.6 (*)    All other components within normal limits  BLOOD GAS, ARTERIAL - Abnormal; Notable for the following:    pH, Arterial 7.265 (*)    pCO2 arterial 83.3 (*)    pO2, Arterial 181 (*)    Bicarbonate 36.6 (*)    Acid-Base Excess 6.5 (*)    All other components within normal limits  BRAIN NATRIURETIC PEPTIDE - Abnormal; Notable for the following:    B Natriuretic Peptide 389.4 (*)    All other components within normal limits  TROPONIN I - Abnormal; Notable for the following:    Troponin I 0.03 (*)    All other components within normal limits  TROPONIN I - Abnormal; Notable for the following:    Troponin I 0.03 (*)    All  other components within normal limits  LACTIC ACID, PLASMA - Abnormal; Notable for the following:    Lactic Acid, Venous 2.3 (*)    All other components within normal limits  CBC WITH DIFFERENTIAL/PLATELET - Abnormal; Notable for the following:    WBC 21.5 (*)    HCT 50.0 (*)    MCV 100.6 (*)    MCHC 28.2 (*)    RDW 20.9 (*)  Neutro Abs 20.0 (*)    All other components within normal limits  GLUCOSE, CAPILLARY - Abnormal; Notable for the following:    Glucose-Capillary 176 (*)    All other components within normal limits  GLUCOSE, CAPILLARY - Abnormal; Notable for the following:    Glucose-Capillary 211 (*)    All other components within normal limits  GLUCOSE, CAPILLARY - Abnormal; Notable for the following:    Glucose-Capillary 189 (*)    All other components within normal limits  GLUCOSE, CAPILLARY - Abnormal; Notable for the following:    Glucose-Capillary 243 (*)    All other components within normal limits  GLUCOSE, CAPILLARY - Abnormal; Notable for the following:    Glucose-Capillary 175 (*)    All other components within normal limits  CBC WITH DIFFERENTIAL/PLATELET - Abnormal; Notable for the following:    WBC 16.8 (*)    HCT 50.5 (*)    MCV 101.4 (*)    MCHC 27.9 (*)    RDW 20.8 (*)    Neutro Abs 15.5 (*)    All other components within normal limits  BASIC METABOLIC PANEL - Abnormal; Notable for the following:    Chloride 97 (*)    CO2 34 (*)    Glucose, Bld 146 (*)    BUN 25 (*)    All other components within normal limits  HEMOGLOBIN A1C - Abnormal; Notable for the following:    Hgb A1c MFr Bld 5.9 (*)    All other components within normal limits  GLUCOSE, CAPILLARY - Abnormal; Notable for the following:    Glucose-Capillary 152 (*)    All other components within normal limits  GLUCOSE, CAPILLARY - Abnormal; Notable for the following:    Glucose-Capillary 232 (*)    All other components within normal limits  GLUCOSE, CAPILLARY - Abnormal; Notable for the  following:    Glucose-Capillary 193 (*)    All other components within normal limits  CBC - Abnormal; Notable for the following:    WBC 15.2 (*)    HCT 49.4 (*)    MCV 100.8 (*)    MCHC 28.1 (*)    RDW 20.9 (*)    All other components within normal limits  GLUCOSE, CAPILLARY - Abnormal; Notable for the following:    Glucose-Capillary 132 (*)    All other components within normal limits  GLUCOSE, CAPILLARY - Abnormal; Notable for the following:    Glucose-Capillary 221 (*)    All other components within normal limits  BLOOD GAS, ARTERIAL - Abnormal; Notable for the following:    pH, Arterial 7.242 (*)    pCO2 arterial 93.7 (*)    Bicarbonate 38.9 (*)    Acid-Base Excess 7.6 (*)    All other components within normal limits  GLUCOSE, CAPILLARY - Abnormal; Notable for the following:    Glucose-Capillary 141 (*)    All other components within normal limits  GLUCOSE, CAPILLARY - Abnormal; Notable for the following:    Glucose-Capillary 226 (*)    All other components within normal limits  GLUCOSE, CAPILLARY - Abnormal; Notable for the following:    Glucose-Capillary 233 (*)    All other components within normal limits  GLUCOSE, CAPILLARY - Abnormal; Notable for the following:    Glucose-Capillary 157 (*)    All other components within normal limits  CULTURE, EXPECTORATED SPUTUM-ASSESSMENT  CULTURE, RESPIRATORY (NON-EXPECTORATED)  MRSA PCR SCREENING  PREGNANCY, URINE  HIV ANTIBODY (ROUTINE TESTING)  SEDIMENTATION RATE  TROPONIN I  PROCALCITONIN  TSH  PROCALCITONIN  LACTIC ACID, PLASMA  MAGNESIUM  PHOSPHORUS  APTT  PROTIME-INR  PROCALCITONIN  MAGNESIUM  PHOSPHORUS  GLUCOSE, CAPILLARY  GLUCOSE, CAPILLARY  GLUCOSE, CAPILLARY    EKG  EKG Interpretation  Date/Time:  Thursday January 27 2017 22:01:13 EDT Ventricular Rate:  101 PR Interval:    QRS Duration: 90 QT Interval:  366 QTC Calculation: 475 R Axis:   87 Text Interpretation:  Sinus tachycardia Non-specific  ST-t changes No old tracing to compare Confirmed by RAY MD, Duwayne Heck [phone]) on 01/28/2017 5:57:44 PM       Radiology No results found.   Ct Angio Chest Pe W Or Wo Contrast  Result Date: 01/28/2017 CLINICAL DATA:  Sudden onset of shortness of breath. EXAM: CT ANGIOGRAPHY CHEST WITH CONTRAST TECHNIQUE: Multidetector CT imaging of the chest was performed using the standard protocol during bolus administration of intravenous contrast. Multiplanar CT image reconstructions and MIPs were obtained to evaluate the vascular anatomy. CONTRAST:  100 mL of Isovue 370 COMPARISON:  Chest x-ray from earlier today FINDINGS: Cardiovascular: The thoracic aorta is normal in caliber with no aneurysm or dissection. Mild cardiomegaly. Respiratory motion limits evaluation of the pulmonary arteries. Within this limitation, no definitive pulmonary emboli identified. Mediastinum/Nodes: There several collateral vessels in the anterior chest wall of uncertain etiology or significance. Lymph nodes in the bilateral axilla and base of neck are normal. There is a mildly enlarged node in the precarinal region on image 34 measuring 15 mm. A few other shotty nodes are seen in the mediastinum and hila of but no other grossly enlarged nodes are noted. The thyroid and esophagus are normal in appearance. No effusions. Lungs/Pleura: The trachea and mainstem bronchi are normal. Suggested mild bronchial wall thickening. No pneumothorax. Subsegmental atelectasis in the bases, lingula, and right middle lobe. There is a peripheral nodule in the right upper lobe on series 10, image 38 measuring up 8 mm. No other suspicious nodules. No masses. No focal infiltrate. No overt edema. Upper Abdomen: No acute abnormality. Musculoskeletal: No chest wall abnormality. No acute or significant osseous findings. Review of the MIP images confirms the above findings. IMPRESSION: 1. Respiratory motion limits evaluation for pulmonary emboli but no central emboli are  identified. 2. Mild cardiomegaly. 3. 8 mm nodule in the periphery of the right upper lobe. This is of low suspicion in a patient of this age unless the patient has a history of malignancy. Non-contrast chest CT at 6-12 months is recommended. If the nodule is stable at time of repeat CT, then future CT at 18-24 months (from today's scan) is considered optional for low-risk patients, but is recommended for high-risk patients. This recommendation follows the consensus statement: Guidelines for Management of Incidental Pulmonary Nodules Detected on CT Images: From the Fleischner Society 2017; Radiology 2017; 284:228-243. 4. Mild bronchial thickening may be seen with bronchitis. 5. A mildly enlarged precarinal node is nonspecific but may be reactive. Recommend follow-up as clinically warranted. Electronically Signed   By: Gerome Sam III M.D   On: 01/28/2017 20:48   Dg Chest Port 1 View  Result Date: 01/28/2017 CLINICAL DATA:  Shortness of breath and wheezing EXAM: PORTABLE CHEST 1 VIEW COMPARISON:  January 28, 2017 FINDINGS: Stable cardiomegaly. No pneumothorax. No pulmonary nodules, masses, or focal infiltrates. No overt edema. IMPRESSION: Cardiomegaly.  No acute abnormality. Electronically Signed   By: Gerome Sam III M.D   On: 01/28/2017 15:32   Dg Chest Portable 1 View  Result Date: 01/27/2017 CLINICAL DATA:  Dyspnea, fever  and wheeze for the past several hours EXAM: PORTABLE CHEST 1 VIEW COMPARISON:  None. FINDINGS: Portable semi upright view of the chest demonstrates borderline cardiomegaly. No aortic aneurysm. No pneumonic consolidation, effusion or pneumothorax. No acute nor suspicious osseous appearing abnormalities. IMPRESSION: No active disease. Electronically Signed   By: Tollie Eth M.D.   On: 01/27/2017 22:53    Procedures Procedures (including critical care time)  Medications Ordered in ED Medications  ipratropium (ATROVENT) nebulizer solution 0.5 mg (0.5 mg Nebulization Given  01/27/17 2232)  methylPREDNISolone sodium succinate (SOLU-MEDROL) 125 mg/2 mL injection 125 mg (125 mg Intravenous Given 01/27/17 2254)  potassium chloride SA (K-DUR,KLOR-CON) CR tablet 40 mEq (40 mEq Oral Given 01/27/17 2356)  famotidine (PEPCID) IVPB 20 mg premix (0 mg Intravenous Stopped 01/28/17 1507)  methylPREDNISolone sodium succinate (SOLU-MEDROL) 125 mg/2 mL injection 60 mg (60 mg Intravenous Given 01/29/17 2337)  ipratropium (ATROVENT) nebulizer solution 1 mg (1 mg Nebulization Given 01/28/17 1513)  iopamidol (ISOVUE-370) 76 % injection (  Canceled Entry 01/28/17 2221)  heparin bolus via infusion 5,000 Units (5,000 Units Intravenous Bolus from Bag 01/28/17 2134)  furosemide (LASIX) injection 40 mg (40 mg Intravenous Given 01/28/17 2221)  oxymetazoline (AFRIN) 0.05 % nasal spray 1 spray (1 spray Each Nare Given 01/30/17 2203)     Initial Impression / Assessment and Plan / ED Course  I have reviewed the triage vital signs and the nursing notes.  Pertinent labs & imaging results that were available during my care of the patient were reviewed by me and considered in my medical decision making (see chart for details).     Persistent hypoxemia despite nebs. Received steroids. CXR w/o focal abnormality. Admit for ongoing tx.   Final Clinical Impressions(s) / ED Diagnoses   Final diagnoses:  Respiratory failure with hypoxia (HCC)  Acute respiratory failure (HCC)  Acute on chronic respiratory failure with hypoxia [facility])    New Prescriptions Discharge Medication List as of 02/01/2017 12:44 PM    START taking these medications   Details  albuterol (PROVENTIL HFA;VENTOLIN HFA) 108 (90 Base) MCG/ACT inhaler Inhale 2 puffs into the lungs every 6 (six) hours as needed for wheezing or shortness of breath., Starting Mon 01/31/2017, Normal    azithromycin (ZITHROMAX) 250 MG tablet Take 1 tablet (250 mg total) by mouth daily., Starting Mon 01/31/2017, Until Tue 02/01/2017, Normal    Budesonide  (PULMICORT FLEXHALER) 90 MCG/ACT inhaler Inhale 1 puff into the lungs 2 (two) times daily., Starting Mon 01/31/2017, Normal    Cetirizine HCl (ZYRTEC ALLERGY) 10 MG CAPS Take 1 capsule (10 mg total) by mouth daily., Starting Mon 01/31/2017, Normal    diphenhydrAMINE (BENADRYL) 25 mg capsule Take 1 capsule (25 mg total) by mouth daily., Starting Tue 02/01/2017, Normal    famotidine (PEPCID) 20 MG tablet Take 1 tablet (20 mg total) by mouth 2 (two) times daily., Starting Mon 01/31/2017, Normal    fluticasone (FLONASE) 50 MCG/ACT nasal spray Place 2 sprays into both nostrils daily., Starting Mon 01/31/2017, Normal    predniSONE (DELTASONE) 10 MG tablet Take 4 tabs for 1 day, then 3 tabs for 3 days, then 2 tabs for 3 days, then 1 tab for 3 days, Normal    sodium chloride (OCEAN) 0.65 % SOLN nasal spray Place 2 sprays into both nostrils 2 (two) times daily., Starting Mon 01/31/2017, Normal         Raeford Razor, MD 02/02/17 [phone]

## 2017-02-09 ENCOUNTER — Ambulatory Visit (INDEPENDENT_AMBULATORY_CARE_PROVIDER_SITE_OTHER): Payer: Self-pay | Admitting: Adult Health

## 2017-02-09 ENCOUNTER — Encounter: Payer: Self-pay | Admitting: Adult Health

## 2017-02-09 VITALS — BP 122/76 | HR 81 | Ht 70.0 in | Wt 304.4 lb

## 2017-02-09 DIAGNOSIS — I272 Pulmonary hypertension, unspecified: Secondary | ICD-10-CM | POA: Insufficient documentation

## 2017-02-09 DIAGNOSIS — J9611 Chronic respiratory failure with hypoxia: Secondary | ICD-10-CM

## 2017-02-09 DIAGNOSIS — R911 Solitary pulmonary nodule: Secondary | ICD-10-CM

## 2017-02-09 DIAGNOSIS — E662 Morbid (severe) obesity with alveolar hypoventilation: Secondary | ICD-10-CM

## 2017-02-09 DIAGNOSIS — J206 Acute bronchitis due to rhinovirus: Secondary | ICD-10-CM

## 2017-02-09 NOTE — Assessment & Plan Note (Signed)
Sleep study - try to schedule asap  She is using somones else's CPAP unsure of setting . She will bring in chip next ov to check control .   Plan  Sleep study

## 2017-02-09 NOTE — Progress Notes (Signed)
@Patient  ID: Elizabeth Campbell, female    DOB: Nov 15, 1980, 36 y.o.   MRN: [id number]  Chief Complaint  Patient presents with  . Follow-up    resp failrue /osa /bronchitis     Referring provider: No ref. provider found  HPI: 36 year old female smoker seen for pulmonary consult for acute hypoxic respiratory failure with Rhinovirus positive bronchitis   TEST  IMAGING/STUDIES: CTA CHEST 4/27:   Imaging somewhat degraded by motion. No central pulmonary embolism. Precarinal enlarged lymph node noted. No other pathologically enlarged mediastinal adenopathy.No pleural effusion or thickening. No pericardial effusion. Some dependent atelectasis is noted. Questionable small cystic changes within the lungs bilaterally. Bronchial wall thickening noted.Radiology measured an 8 mm nodule in the periphery of the right upper lobe but on my review this appeared to be only 6 mm.  TTE 4/29:  Normal LV systolic/diastolic dysfunction, RV mildly dilated with moderate pulmonary hypertension (PA peak pressure 46 mm Hg)  MICROBIOLOGY: MRSA PCR 4/27:  Negative HIV 4/27:  Nonreactive Sputum Culture 4/27 >> normal flora   02/09/2017 Follow up ; Post hospital follow up Patient returns for a post hospital follow-up Patient was admitted last week with an acute bronchitis with positive rhinovirus. She had acute respiratory failure. Required BiPAP support along with high flow O2, She was treated with IV antibiotics, steroids and nebulized bronchodilators. Patient was also noted to have a significant rash. Felt to be possible viral exanthem versus allergic urticaria. She's been recommended to have an outpatient workup if persists. 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 No previous dx of COPD /Asthma .   Discharged on Oxygen 6l/m . She has cut  back to 2l/m . O2 sats on arrival today 2l/m .  Was suppose to be discharged on BIPAP but insurance would not cover.  She is using her friend's CPAP At bedtime  . ? Setting  Walk test in office showed pt needed O2 at 2l/m to keep O2 sats >90%    Has restarted smoking but less . Discussed smoking cessation .   CT chest did show a right upper lobe nodule at 8 mm.  Says since discharge she is feeling so much better with decreased cough and congestion  Rash has totally resolved.     Allergies  Allergen Reactions  . Advil [Ibuprofen] Hives and Itching     There is no immunization history on file for this patient.  History reviewed. No pertinent past medical history.  Tobacco History: History  Smoking Status  . Current Every Day Smoker  . Packs/day: 1.00  . Years: 15.00  . Types: Cigarettes  Smokeless Tobacco  . Never Used   Ready to quit: No Counseling given: Yes   Outpatient Encounter Prescriptions as of 02/09/2017  Medication Sig  . albuterol (PROVENTIL HFA;VENTOLIN HFA) 108 (90 Base) MCG/ACT inhaler Inhale 2 puffs into the lungs every 6 (six) hours as needed for wheezing or shortness of breath.  . Budesonide (PULMICORT FLEXHALER) 90 MCG/ACT inhaler Inhale 1 puff into the lungs 2 (two) times daily.  . Cetirizine HCl (ZYRTEC ALLERGY) 10 MG CAPS Take 1 capsule (10 mg total) by mouth daily.  . diphenhydrAMINE (BENADRYL) 25 mg capsule Take 1 capsule (25 mg total) by mouth daily.  . famotidine (PEPCID) 20 MG tablet Take 1 tablet (20 mg total) by mouth 2 (two) times  daily.  . fluticasone (FLONASE) 50 MCG/ACT nasal spray Place 2 sprays into both nostrils daily.  . Multiple Vitamin (MULTIVITAMIN WITH MINERALS) TABS tablet Take 1 tablet by mouth daily.  Marland Kitchen. OVER THE COUNTER MEDICATION Take 1 tablet by mouth daily.  . predniSONE (DELTASONE) 10 MG tablet Take 4 tabs for 1 day, then 3 tabs for 3 days, then 2 tabs for 3 days, then 1 tab for 3 days  . sodium chloride (OCEAN) 0.65 % SOLN  nasal spray Place 2 sprays into both nostrils 2 (two) times daily.  . vitamin B-12 (CYANOCOBALAMIN) 1000 MCG tablet Take 1,000 mcg by mouth daily.   No facility-administered encounter medications on file as of 02/09/2017.      Review of Systems  Constitutional:   No  weight loss, night sweats,  Fevers, chills,  +fatigue, or  lassitude.  HEENT:   No headaches,  Difficulty swallowing,  Tooth/dental problems, or  Sore throat,                No sneezing, itching, ear ache, nasal congestion, post nasal drip,   CV:  No chest pain,  Orthopnea, PND, swelling in lower extremities, anasarca, dizziness, palpitations, syncope.   GI  No heartburn, indigestion, abdominal pain, nausea, vomiting, diarrhea, change in bowel habits, loss of appetite, bloody stools.   Resp:  .  No chest wall deformity  Skin: no rash or lesions.  GU: no dysuria, change in color of urine, no urgency or frequency.  No flank pain, no hematuria   MS:  No joint pain or swelling.  No decreased range of motion.  No back pain.    Physical Exam  BP 122/76 (BP Location: Left Arm, Cuff Size: Normal)   Pulse 81   Ht 5\' 10"  (1.778 m)   Wt (!) 304 lb 6.4 oz (138.1 kg)   LMP 01/27/2017   SpO2 95%   BMI 43.68 kg/m   GEN: A/Ox3; pleasant , NAD, obese    HEENT:  [place]/AT,  EACs-clear, TMs-wnl, NOSE-clear, THROAT-clear, no lesions, no postnasal drip or exudate noted.   NECK:  Supple w/ fair ROM; no JVD; normal carotid impulses w/o bruits; no thyromegaly or nodules palpated; no lymphadenopathy.    RESP  Clear  P & A; w/o, wheezes/ rales/ or rhonchi. no accessory muscle use, no dullness to percussion  CARD:  RRR, no m/r/g, tr  peripheral edema, pulses intact, no cyanosis or clubbing.  GI:   Soft & nt; nml bowel sounds; no organomegaly or masses detected.   Musco: Warm bil, no deformities or joint swelling noted.   Neuro: alert, no focal deficits noted.    Skin: Warm, no lesions or rashes    Lab Results:   ProBNP No  results found for: PROBNP  Imaging: Ct Angio Chest Pe W Or Wo Contrast  Result Date: 01/28/2017 CLINICAL DATA:  Sudden onset of shortness of breath. EXAM: CT ANGIOGRAPHY CHEST WITH CONTRAST TECHNIQUE: Multidetector CT imaging of the chest was performed using the standard protocol during bolus administration of intravenous contrast. Multiplanar CT image reconstructions and MIPs were obtained to evaluate the vascular anatomy. CONTRAST:  100 mL of Isovue 370 COMPARISON:  Chest x-ray from earlier today FINDINGS: Cardiovascular: The thoracic aorta is normal in caliber with no aneurysm or dissection. Mild cardiomegaly. Respiratory motion limits evaluation of the pulmonary arteries. Within this limitation, no definitive pulmonary emboli identified. Mediastinum/Nodes: There several collateral vessels in the anterior chest wall of uncertain etiology or significance. Lymph nodes in the  bilateral axilla and base of neck are normal. There is a mildly enlarged node in the precarinal region on image 34 measuring 15 mm. A few other shotty nodes are seen in the mediastinum and hila of but no other grossly enlarged nodes are noted. The thyroid and esophagus are normal in appearance. No effusions. Lungs/Pleura: The trachea and mainstem bronchi are normal. Suggested mild bronchial wall thickening. No pneumothorax. Subsegmental atelectasis in the bases, lingula, and right middle lobe. There is a peripheral nodule in the right upper lobe on series 10, image 38 measuring up 8 mm. No other suspicious nodules. No masses. No focal infiltrate. No overt edema. Upper Abdomen: No acute abnormality. Musculoskeletal: No chest wall abnormality. No acute or significant osseous findings. Review of the MIP images confirms the above findings. IMPRESSION: 1. Respiratory motion limits evaluation for pulmonary emboli but no central emboli are identified. 2. Mild cardiomegaly. 3. 8 mm nodule in the periphery of the right upper lobe. This is of low  suspicion in a patient of this age unless the patient has a history of malignancy. Non-contrast chest CT at 6-12 months is recommended. If the nodule is stable at time of repeat CT, then future CT at 18-24 months (from today's scan) is considered optional for low-risk patients, but is recommended for high-risk patients. This recommendation follows the consensus statement: Guidelines for Management of Incidental Pulmonary Nodules Detected on CT Images: From the Fleischner Society 2017; Radiology 2017; 284:228-243. 4. Mild bronchial thickening may be seen with bronchitis. 5. A mildly enlarged precarinal node is nonspecific but may be reactive. Recommend follow-up as clinically warranted. Electronically Signed   By: Gerome Sam III M.D   On: 01/28/2017 20:48   Dg Chest Port 1 View  Result Date: 01/28/2017 CLINICAL DATA:  Shortness of breath and wheezing EXAM: PORTABLE CHEST 1 VIEW COMPARISON:  January 28, 2017 FINDINGS: Stable cardiomegaly. No pneumothorax. No pulmonary nodules, masses, or focal infiltrates. No overt edema. IMPRESSION: Cardiomegaly.  No acute abnormality. Electronically Signed   By: Gerome Sam III M.D   On: 01/28/2017 15:32   Dg Chest Portable 1 View  Result Date: 01/27/2017 CLINICAL DATA:  Dyspnea, fever and wheeze for the past several hours EXAM: PORTABLE CHEST 1 VIEW COMPARISON:  None. FINDINGS: Portable semi upright view of the chest demonstrates borderline cardiomegaly. No aortic aneurysm. No pneumonic consolidation, effusion or pneumothorax. No acute nor suspicious osseous appearing abnormalities. IMPRESSION: No active disease. Electronically Signed   By: Tollie Eth M.D.   On: 01/27/2017 22:53     Assessment & Plan:   Obesity hypoventilation syndrome (HCC) Sleep study - try to schedule asap  She is using somones else's CPAP unsure of setting . She will bring in chip next ov to check control .   Plan  Sleep study   Acute bronchitis Recent flare with rhinovirus -tx w/  abx and steroids  Now resolved .  Smoking cessation . Check PFT on return to check for COPD/Asthma .   Respiratory failure with hypoxia (HCC) Improved oxygen demands  Cont on O2 at 2l/m  POC ordered.    Pulmonary hypertension (HCC) PUlmonary HTN found on echo  ? osa/ohs  Check sleep study  Cont on o2 .      Rubye Oaks, NP 02/09/2017

## 2017-02-09 NOTE — Assessment & Plan Note (Signed)
PUlmonary HTN found on echo  ? osa/ohs  Check sleep study  Cont on o2 .

## 2017-02-09 NOTE — Patient Instructions (Addendum)
Set up CT chest without contrast in 3 months to follow-up lung nodule Work on quitting smoking. Set up for sleep study-ASAP  Bring CPAP chip to next office visit if we have not gotten you set up for CPAP.  Continue on Oxygen 2ll/m oxygen  Order for POC .  Follow-up with Dr. Delton CoombesByrum in 6 weeks with pulmonary function test. Please contact office for sooner follow up if symptoms do not improve or worsen or seek emergency care

## 2017-02-09 NOTE — Assessment & Plan Note (Signed)
Recent flare with rhinovirus -tx w/ abx and steroids  Now resolved .  Smoking cessation . Check PFT on return to check for COPD/Asthma .

## 2017-02-09 NOTE — Assessment & Plan Note (Signed)
Improved oxygen demands  Cont on O2 at 2l/m  POC ordered.

## 2017-02-10 ENCOUNTER — Encounter: Payer: Self-pay | Admitting: Physician Assistant

## 2017-02-10 ENCOUNTER — Ambulatory Visit: Payer: Medicaid Other | Attending: Internal Medicine | Admitting: Physician Assistant

## 2017-02-10 VITALS — BP 135/84 | HR 78 | Temp 98.7°F | Resp 16 | Wt 306.0 lb

## 2017-02-10 DIAGNOSIS — Z888 Allergy status to other drugs, medicaments and biological substances status: Secondary | ICD-10-CM | POA: Insufficient documentation

## 2017-02-10 DIAGNOSIS — R21 Rash and other nonspecific skin eruption: Secondary | ICD-10-CM

## 2017-02-10 DIAGNOSIS — J9601 Acute respiratory failure with hypoxia: Secondary | ICD-10-CM

## 2017-02-10 DIAGNOSIS — J206 Acute bronchitis due to rhinovirus: Secondary | ICD-10-CM

## 2017-02-10 DIAGNOSIS — Z9981 Dependence on supplemental oxygen: Secondary | ICD-10-CM | POA: Insufficient documentation

## 2017-02-10 DIAGNOSIS — F17210 Nicotine dependence, cigarettes, uncomplicated: Secondary | ICD-10-CM | POA: Insufficient documentation

## 2017-02-10 DIAGNOSIS — D72829 Elevated white blood cell count, unspecified: Secondary | ICD-10-CM

## 2017-02-10 DIAGNOSIS — J9602 Acute respiratory failure with hypercapnia: Secondary | ICD-10-CM

## 2017-02-10 DIAGNOSIS — R739 Hyperglycemia, unspecified: Secondary | ICD-10-CM | POA: Diagnosis not present

## 2017-02-10 DIAGNOSIS — I272 Pulmonary hypertension, unspecified: Secondary | ICD-10-CM

## 2017-02-10 DIAGNOSIS — F17200 Nicotine dependence, unspecified, uncomplicated: Secondary | ICD-10-CM

## 2017-02-10 DIAGNOSIS — Z79899 Other long term (current) drug therapy: Secondary | ICD-10-CM | POA: Insufficient documentation

## 2017-02-10 DIAGNOSIS — R911 Solitary pulmonary nodule: Secondary | ICD-10-CM | POA: Diagnosis not present

## 2017-02-10 NOTE — Progress Notes (Signed)
Patient ID: Elizabeth Campbell, female   DOB: [birth year], 36 y.o.   MRN: [id number]     Elizabeth Campbell, is a 36 y.o. female  [id number]  [id number]  DOB - [birth year]  Subjective:  Chief Complaint and HPI: Elizabeth Campbell is a 36 y.o. female here today to establish care and for a follow up visitafter being hospitalized 4/26-5/10/2016 while being treated for SOB and bronchitis when she decompensated, went into ARF and was transferred to stepdown for BiPAP.  She also tested +rhinovirus.  Discharged on azithromycin, prednisone taper, flonase, saline nasal spray, pulmicort, and albuterol.   Her last dose of prednisone is tomorrow.  Also on home O2, bipap/trilogy vent at night.  She also had a rash during hospitalization that was believed to be allergic and treated with benadrtl, pepcid, zyrtec.  Outpatient allergy testing has been recommended.  She relates to me that this rash flared a few days prior to hospitalization as she was starting to feel sick then worsened during hospitalization.  She is on 2L O2 but does not run tank at rest.  She reports feeling ok/ not SOB at rest even when pulse ox is 94-95% on RA.  She denies cough.  Using a neighbors CPAP at night.  She does not know what the settings are on.    She Restarted smoking, bust says she is smoking less(about 3 cigs/day).  Today, she says she is feeling much better overall.  Prior to all of this, she was working full-time as a LawyerCNA.   She denies h/o elevated blood sugar.  She denies S/Sx hyper/hypoglycemia.    CTA was neg for PE but did show 8mm lung nodule to be followed by pulmonary.  Echo showed moderate pulmonary htn.  She qualified for home O2.    Other f/up: Saw pulmonology 02/09/2017-see note.  Pulmonary htn believed to be related to OSA/OHS-sleep study is ordered. Sleep study scheduled 04/12/2017.    Marland Kitchen.   ED/Hospital notes reviewed.   Social History:  Married, husband smokes in the home(she is making him smoke outside now), she  is down to 3 cigarettes per day.  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 Family history:  ROS:   Constitutional:  No f/c, No night sweats, No unexplained weight loss. EENT:  No vision changes, No blurry vision, No hearing changes. No mouth, throat, or ear problems.  Respiratory: No cough, Improving SOB Cardiac: No CP, no palpitations GI:  No abd pain, No N/V/D. GU: No Urinary s/sx Musculoskeletal: No joint pain Neuro: No headache, no dizziness, no motor weakness.  Skin: resolved rash Endocrine:  No polydipsia. No polyuria.  Psych: Denies SI/HI  No problems updated.  ALLERGIES: Allergies  Allergen Reactions  . Advil [Ibuprofen] Hives and Itching    PAST MEDICAL HISTORY: No past medical history on file.  MEDICATIONS AT HOME: Prior to Admission medications   Medication Sig Start Date End Date Taking? Authorizing Provider  albuterol (PROVENTIL HFA;VENTOLIN HFA) 108 (90 Base) MCG/ACT inhaler Inhale 2 puffs into the lungs every 6 (six) hours as needed for wheezing or shortness of breath. 01/31/17   Elizabeth Campbell, Jennifer Chahn-Yang, DO  Budesonide (PULMICORT FLEXHALER) 90 MCG/ACT inhaler Inhale 1 puff into the lungs 2 (two) times daily. 01/31/17   Elizabeth Campbell, Jennifer Chahn-Yang, DO  Cetirizine HCl (ZYRTEC ALLERGY) 10 MG CAPS Take 1 capsule (10 mg total) by mouth daily. 01/31/17   Elizabeth Campbell, Jennifer Chahn-Yang, DO  diphenhydrAMINE (BENADRYL) 25 mg capsule Take 1 capsule (25 mg total) by mouth  daily. 02/01/17   Elizabeth Stain Chahn-Yang, DO  famotidine (PEPCID) 20 MG tablet Take 1 tablet (20 mg total) by mouth 2 (two) times daily. 01/31/17   Elizabeth Stain Chahn-Yang, DO  fluticasone (FLONASE) 50 MCG/ACT nasal spray Place 2 sprays into both nostrils daily. 01/31/17   Elizabeth Hawks, DO  Multiple Vitamin (MULTIVITAMIN WITH MINERALS) TABS tablet Take 1 tablet by mouth daily.    [provider]  OVER THE COUNTER MEDICATION Take 1 tablet by mouth daily.    [provider]  predniSONE  (DELTASONE) 10 MG tablet Take 4 tabs for 1 day, then 3 tabs for 3 days, then 2 tabs for 3 days, then 1 tab for 3 days 01/31/17   Elizabeth Stain Chahn-Yang, DO  sodium chloride (OCEAN) 0.65 % SOLN nasal spray Place 2 sprays into both nostrils 2 (two) times daily. 01/31/17   Elizabeth Stain Chahn-Yang, DO  vitamin B-12 (CYANOCOBALAMIN) 1000 MCG tablet Take 1,000 mcg by mouth daily.    [provider]     Objective:  EXAM:   Vitals:   02/10/17 1143  BP: 135/84  Pulse: 78  Resp: 16  Temp: 98.7 F (37.1 C)  TempSrc: Oral  SpO2: 94%  Weight: (!) 306 lb (138.8 kg)    General appearance : A&OX3. NAD. Non-toxic-appearing, walking/ambulating unassisted on O2.  Pulse ox up to 95% on RA HEENT: Atraumatic and Normocephalic.  PERRLA. EOM intact.  TM clear B. Mouth-MMM, post pharynx WNL w/o erythema, No PND. Neck: supple, no JVD. No cervical lymphadenopathy. No thyromegaly Chest/Lungs:  Breathing-non-labored, Fair air entry bilaterally, breath sounds normal without rales, rhonchi, or wheezing  CVS: S1 S2 regular, no murmurs, gallops, rubs  Extremities: Bilateral Lower Ext shows no edema, both legs are warm to touch with = pulse throughout Neurology:  CN II-XII grossly intact, Non focal.   Psych:  TP linear. J/I WNL. Normal speech. Appropriate eye contact and affect.  Skin:  No Rash  Data Review Lab Results  Component Value Date   HGBA1C 5.9 (H) 01/30/2017     Assessment & Plan   1. Pulmonary hypertension (HCC) Saw pulmonary yesterday-continue to keep f/up with them  2. Acute bronchitis due to Rhinovirus reolved-meds finished  3. Acute respiratory failure with hypoxia and hypercapnia (HCC) resolved  4. Rash May have been related to the illness she had as the rash actually started PTH then worsened during hospital course. No rash now.  Will still refer- - Ambulatory referral to Allergy  5. Leukocytosis, unspecified type Likely related to prednisone and illness-will recheck  today - CBC with Differential/Platelet  6. Hyperglycemia May all be related to recent prednisone, but A1C was 5.9 - Basic metabolic panel I have had a lengthy discussion and provided education about insulin resistance and the intake of too much sugar/refined carbohydrates.  I have advised the patient to work at a goal of eliminating sugary drinks, candy, desserts, sweets, refined sugars, processed foods, and white carbohydrates.  The patient expresses understanding.   7. Smoker Smoking cessation discussed and encouraged.  Materials provided.    Patient have been counseled extensively about nutrition and exercise  Return in about 3 weeks (around 03/03/2017) for assign PCP; recheck breathing.  The patient was given clear instructions to go to ER or return to medical center if symptoms don't improve, worsen or new problems develop. The patient verbalized understanding. The patient was told to call to get lab results if they haven't heard anything in the next week.  Georgian Co, PA-C [facility] [place], [place] [phone]   02/10/2017, 11:56 AM

## 2017-02-10 NOTE — Patient Instructions (Signed)
1-800-quitnow for smoking cessation resources  Steps to Quit Smoking Smoking tobacco can be bad for your health. It can also affect almost every organ in your body. Smoking puts you and people around you at risk for many serious long-lasting (chronic) diseases. Quitting smoking is hard, but it is one of the best things that you can do for your health. It is never too late to quit. What are the benefits of quitting smoking? When you quit smoking, you lower your risk for getting serious diseases and conditions. They can include:  Lung cancer or lung disease.  Heart disease.  Stroke.  Heart attack.  Not being able to have children (infertility).  Weak bones (osteoporosis) and broken bones (fractures). If you have coughing, wheezing, and shortness of breath, those symptoms may get better when you quit. You may also get sick less often. If you are pregnant, quitting smoking can help to lower your chances of having a baby of low birth weight. What can I do to help me quit smoking? Talk with your doctor about what can help you quit smoking. Some things you can do (strategies) include:  Quitting smoking totally, instead of slowly cutting back how much you smoke over a period of time.  Going to in-person counseling. You are more likely to quit if you go to many counseling sessions.  Using resources and support systems, such as:  Online chats with a counselor.  Phone quitlines.  Printed Materials engineerself-help Veterinary surgeonmaterials.  Support groups or group counseling.  Text messaging programs.  Mobile phone apps or applications.  Taking medicines. Some of these medicines may have nicotine in them. If you are pregnant or breastfeeding, do not take any medicines to quit smoking unless your doctor says it is okay. Talk with your doctor about counseling or other things that can help you. Talk with your doctor about using more than one strategy at the same time, such as taking medicines while you are also going to  in-person counseling. This can help make quitting easier. What things can I do to make it easier to quit? Quitting smoking might feel very hard at first, but there is a lot that you can do to make it easier. Take these steps:  Talk to your family and friends. Ask them to support and encourage you.  Call phone quitlines, reach out to support groups, or work with a Veterinary surgeoncounselor.  Ask people who smoke to not smoke around you.  Avoid places that make you want (trigger) to smoke, such as:  Bars.  Parties.  Smoke-break areas at work.  Spend time with people who do not smoke.  Lower the stress in your life. Stress can make you want to smoke. Try these things to help your stress:  Getting regular exercise.  Deep-breathing exercises.  Yoga.  Meditating.  Doing a body scan. To do this, close your eyes, focus on one area of your body at a time from head to toe, and notice which parts of your body are tense. Try to relax the muscles in those areas.  Download or buy apps on your mobile phone or tablet that can help you stick to your quit plan. There are many free apps, such as QuitGuide from the [employer] developer(Centers for Disease Control and Prevention). You can find more support from smokefree.gov and other websites. This information is not intended to replace advice given to you by your health care provider. Make sure you discuss any questions you have with your health care provider. Document  Released: 07/17/2009 Document Revised: 05/18/2016 Document Reviewed: 02/04/2015 Elsevier Interactive Patient Education  2017 [employer].

## 2017-02-10 NOTE — Addendum Note (Signed)
Addended by: Boone MasterJONES, Kyilee Gregg E on: 02/10/2017 04:37 PM   Modules accepted: Orders

## 2017-02-11 ENCOUNTER — Telehealth: Payer: Self-pay

## 2017-02-11 LAB — BASIC METABOLIC PANEL
BUN/Creatinine Ratio: 16 (ref 9–23)
BUN: 13 mg/dL (ref 6–20)
CALCIUM: 10.7 mg/dL — AB (ref 8.7–10.2)
CHLORIDE: 96 mmol/L (ref 96–106)
CO2: 30 mmol/L — ABNORMAL HIGH (ref 18–29)
CREATININE: 0.83 mg/dL (ref 0.57–1.00)
GFR, EST AFRICAN AMERICAN: 106 mL/min/{1.73_m2} (ref >59)
GFR, EST NON AFRICAN AMERICAN: 92 mL/min/{1.73_m2} (ref >59)
Glucose: 83 mg/dL (ref 65–99)
Potassium: 4 mmol/L (ref 3.5–5.2)
Sodium: 141 mmol/L (ref 134–144)

## 2017-02-11 LAB — CBC WITH DIFFERENTIAL/PLATELET
BASOS: 0 %
Basophils Absolute: 0 10*3/uL (ref 0.0–0.2)
EOS (ABSOLUTE): 0.2 10*3/uL (ref 0.0–0.4)
EOS: 1 %
HEMATOCRIT: 49.1 % — AB (ref 34.0–46.6)
Hemoglobin: 15.3 g/dL (ref 11.1–15.9)
Immature Grans (Abs): 0 10*3/uL (ref 0.0–0.1)
Immature Granulocytes: 0 %
LYMPHS: 11 %
Lymphocytes Absolute: 1.8 10*3/uL (ref 0.7–3.1)
MCH: 28.4 pg (ref 26.6–33.0)
MCHC: 31.2 g/dL — ABNORMAL LOW (ref 31.5–35.7)
MCV: 91 fL (ref 79–97)
MONOCYTES: 4 %
Monocytes Absolute: 0.7 10*3/uL (ref 0.1–0.9)
NEUTROS PCT: 84 %
Neutrophils Absolute: 13 10*3/uL — ABNORMAL HIGH (ref 1.4–7.0)
Platelets: 267 10*3/uL (ref 150–379)
RBC: 5.39 x10E6/uL — AB (ref 3.77–5.28)
RDW: 18.5 % — ABNORMAL HIGH (ref 12.3–15.4)
WBC: 15.8 10*3/uL — ABNORMAL HIGH (ref 3.4–10.8)

## 2017-02-11 NOTE — Telephone Encounter (Signed)
Contacted pt to go over lab results pt is aware and doesn't have any questions or concerns 

## 2017-02-14 ENCOUNTER — Telehealth: Payer: Self-pay | Admitting: Adult Health

## 2017-02-14 NOTE — Telephone Encounter (Signed)
Spoke with Elizabeth Campbell-states patient was discharged from hospital on O2-they are not able to give POC to patient-they are only able to give patient what they have at no cost.   Pt is aware and has applied for health insurance; once she receives confirmation of health insurance she will let us know so we may re-order for POC.   Nothing more needed at this time.

## 2017-02-14 NOTE — Telephone Encounter (Signed)
[facility]bara CowerJason [facility]( [facility] [phone] ext [phone]) states this is a charity patient, can only offer the patient what [facility] have, patient not eligible for portable oxygen...ert

## 2017-03-10 ENCOUNTER — Encounter: Payer: Self-pay | Admitting: Internal Medicine

## 2017-03-10 ENCOUNTER — Ambulatory Visit: Payer: Self-pay | Attending: Internal Medicine | Admitting: Internal Medicine

## 2017-03-10 VITALS — BP 120/78 | HR 86 | Temp 98.1°F | Resp 18 | Ht 70.0 in | Wt 312.0 lb

## 2017-03-10 DIAGNOSIS — J9691 Respiratory failure, unspecified with hypoxia: Secondary | ICD-10-CM | POA: Insufficient documentation

## 2017-03-10 DIAGNOSIS — L02422 Furuncle of left axilla: Secondary | ICD-10-CM | POA: Insufficient documentation

## 2017-03-10 DIAGNOSIS — Z79899 Other long term (current) drug therapy: Secondary | ICD-10-CM | POA: Insufficient documentation

## 2017-03-10 DIAGNOSIS — L02429 Furuncle of limb, unspecified: Secondary | ICD-10-CM | POA: Insufficient documentation

## 2017-03-10 DIAGNOSIS — E669 Obesity, unspecified: Secondary | ICD-10-CM | POA: Insufficient documentation

## 2017-03-10 DIAGNOSIS — L0293 Carbuncle, unspecified: Secondary | ICD-10-CM

## 2017-03-10 DIAGNOSIS — Z6841 Body Mass Index (BMI) 40.0 and over, adult: Secondary | ICD-10-CM | POA: Insufficient documentation

## 2017-03-10 DIAGNOSIS — I272 Pulmonary hypertension, unspecified: Secondary | ICD-10-CM | POA: Insufficient documentation

## 2017-03-10 DIAGNOSIS — F17200 Nicotine dependence, unspecified, uncomplicated: Secondary | ICD-10-CM | POA: Insufficient documentation

## 2017-03-10 DIAGNOSIS — F17210 Nicotine dependence, cigarettes, uncomplicated: Secondary | ICD-10-CM | POA: Insufficient documentation

## 2017-03-10 MED ORDER — SULFAMETHOXAZOLE-TRIMETHOPRIM 800-160 MG PO TABS: 1.0000 | ORAL_TABLET | Freq: Two times a day (BID) | ORAL | 0 refills | Status: DC

## 2017-03-10 MED ORDER — MUPIROCIN 2 % EX OINT: 1.0000 "application " | TOPICAL_OINTMENT | Freq: Two times a day (BID) | CUTANEOUS | 0 refills | Status: DC

## 2017-03-10 MED FILL — SULFAMETHOXAZOLE/TMP DS TAB: 800-160 | 7 days supply | Qty: 14 | Fill #0

## 2017-03-10 MED FILL — MUPIROCIN 2% OINTMENT: 2 | 5 days supply | Qty: 22 | Fill #0

## 2017-03-10 NOTE — Progress Notes (Signed)
Patient ID: Elizabeth Campbell, female    DOB: [birth year]  MRN: [id number]  CC: No chief complaint on file.   Subjective: Elizabeth Campbell is a 36 y.o. female who presents for f/u to become establish with me as PCP. Her concerns today include:  Last seen by our PA 02/10/2017 post hospitalization for acute respiratory failure requiring BiPAP caused by rhinovirus bronchitis. Found to have PTAH thought to be due to OSA/OHS.  Discharged home on oxygen with sleep study scheduled for next month.  Resp: Check Pox at home.  Levels staying at 98 at rest and activity without oxygen.  Lowest was 95. -Feels breathing is back to baseline. Denies shortness of breath or cough. -Working on trying to lose weight. Goes to gym and pool 3-7 days a week.  "I try to do something rather than sitting around."   -eating habits getting better.  Reads a lot of pamphlets about healthy diet.   Drinking mainly water.  Weakness is sweet tea but only 1 8oz glass a day.  Down from 6 sodas to 2 sodas a day  Tob: down to 5/day from 1 pk/day. She is determined to quit and plans to call 1 800 quit now. Her significant other smokes.  3.  C/O Sores/boils in axilla and upper thigh Intermittent x few yrs but worse recently  Patient Active Problem List   Diagnosis Date Noted  . Pulmonary hypertension (HCC) 02/09/2017  . Obesity hypoventilation syndrome (HCC)   . Moderate persistent asthma with acute exacerbation   . Acute bronchitis 01/28/2017  . Acute respiratory failure (HCC)   . Respiratory failure with hypoxia [facility])      Current Outpatient Prescriptions on File Prior to Visit  Medication Sig Dispense Refill  . albuterol (PROVENTIL HFA;VENTOLIN HFA) 108 (90 Base) MCG/ACT inhaler Inhale 2 puffs into the lungs every 6 (six) hours as needed for wheezing or shortness of breath. 1 Inhaler 0  . Budesonide (PULMICORT FLEXHALER) 90 MCG/ACT inhaler Inhale 1 puff into the lungs 2 (two) times daily. 1 Inhaler 0  . Cetirizine HCl  (ZYRTEC ALLERGY) 10 MG CAPS Take 1 capsule (10 mg total) by mouth daily. 30 capsule 0  . diphenhydrAMINE (BENADRYL) 25 mg capsule Take 1 capsule (25 mg total) by mouth daily. 30 capsule 0  . famotidine (PEPCID) 20 MG tablet Take 1 tablet (20 mg total) by mouth 2 (two) times daily. 60 tablet 0  . fluticasone (FLONASE) 50 MCG/ACT nasal spray Place 2 sprays into both nostrils daily. 16 g 0  . Multiple Vitamin (MULTIVITAMIN WITH MINERALS) TABS tablet Take 1 tablet by mouth daily.    . sodium chloride (OCEAN) 0.65 % SOLN nasal spray Place 2 sprays into both nostrils 2 (two) times daily. 1 Bottle 0  . vitamin B-12 (CYANOCOBALAMIN) 1000 MCG tablet Take 1,000 mcg by mouth daily.    Marland Kitchen OVER THE COUNTER MEDICATION Take 1 tablet by mouth daily.    . predniSONE (DELTASONE) 10 MG tablet Take 4 tabs for 1 day, then 3 tabs for 3 days, then 2 tabs for 3 days, then 1 tab for 3 days (Patient not taking: Reported on 03/10/2017) 22 tablet 0   No current facility-administered medications on file prior to visit.     Allergies  Allergen Reactions  . Advil [Ibuprofen] Hives and Itching    Social History   Social History  . Marital status: Married    Spouse name: N/A  . Number of children: N/A  . Years of  education: N/A   Occupational History  . Not on file.   Social History Main Topics  . Smoking status: Current Every Day Smoker    Packs/day: 1.00    Years: 15.00    Types: Cigarettes  . Smokeless tobacco: Never Used  . Alcohol use No  . Drug use: No  . Sexual activity: No   Other Topics Concern  . Not on file   Social History Narrative  . No narrative on file    Family History  Problem Relation Age of Onset  . Hypertension Mother   . Cancer Father     Past Surgical History:  Procedure Laterality Date  . LAPAROSCOPIC TUBAL LIGATION    . TONSILLECTOMY    . TUBAL LIGATION      ROS: Review of Systems As stated above.  PHYSICAL EXAM: BP 120/78 (BP Location: Right Arm, Patient  Position: Sitting, Cuff Size: Large)   Pulse 86   Temp 98.1 F (36.7 C) (Oral)   Resp 18   Ht 5\' 10"  (1.778 m)   Wt (!) 312 lb (141.5 kg)   LMP 02/22/2017   SpO2 93%   BMI 44.77 kg/m   Wt Readings from Last 3 Encounters:  03/10/17 (!) 312 lb (141.5 kg)  02/10/17 (!) 306 lb (138.8 kg)  02/09/17 (!) 304 lb 6.4 oz (138.1 kg)   Physical Exam General appearance - alert, well appearing, and in no distress Mental status - alert, oriented to person, place, and time, normal mood, behavior, speech, dress, motor activity, and thought processes Neck - supple, no significant adenopathy Chest - clear to auscultation, no wheezes, rales or rhonchi, symmetric air entry Heart - normal rate, regular rhythm, normal S1, S2, no murmurs, rubs, clicks or gallops Extremities - peripheral pulses normal, no pedal edema, no clubbing or cyanosis Skin - 2 cm raised, indurated, warm area on the right upper thigh; smaller 1 in left.  Some scarring noted in left axilla from previous boils  ASSESSMENT AND PLAN: 1. Pulmonary hypertension (HCC) -Keep appointment for sleep study. -continue to monitor O2 levels. May not require O2 long term  2. Recurrent boils Likely MRSA vs hidradenitis suppurativa -Bactrim 1 week.  Bactroban nasal ointment for 5 days -Good handwashing and use of antibacterial soap  3. Tobacco dependence -cont to work on quitting. She declines rxn for NRT.  Plans to call 1-800-Quit Now  4.  Obesity -Commended her on efforts to lose weight and encouraged her to continue regular aerobic exercise and healthy eating habits..  She has set a goal to discontinue drinking regular sodas. Programme researcher, broadcasting/film/videorinted materials given on healthy eating habits. HM: Patient deferred on having Tdap and Pap today today.  She has been scheduled for 6 week return to have these done.  Patient was given the opportunity to ask questions.  Patient verbalized understanding of the plan and was able to repeat key elements of the plan.    No orders of the defined types were placed in this encounter.    Requested Prescriptions   Signed Prescriptions Disp Refills  . sulfamethoxazole-trimethoprim (BACTRIM DS,SEPTRA DS) 800-160 MG tablet 14 tablet 0    Sig: Take 1 tablet by mouth 2 (two) times daily.  . mupirocin ointment (BACTROBAN) 2 % 22 g 0    Sig: Place 1 application into the nose 2 (two) times daily. For 5 days.    Return in about 6 weeks (around 04/21/2017) for PAP.  Jonah Blueeborah Jose Corvin, MD, FACP

## 2017-03-10 NOTE — Progress Notes (Signed)
F/U Bronchitis  Tobacco user -5 cigarette per day  No pain today

## 2017-04-12 ENCOUNTER — Institutional Professional Consult (permissible substitution): Payer: Self-pay | Admitting: Pulmonary Disease

## 2017-04-14 ENCOUNTER — Encounter (HOSPITAL_BASED_OUTPATIENT_CLINIC_OR_DEPARTMENT_OTHER): Payer: Self-pay

## 2017-04-14 ENCOUNTER — Other Ambulatory Visit: Payer: Self-pay | Admitting: Emergency Medicine

## 2017-04-14 DIAGNOSIS — R0600 Dyspnea, unspecified: Secondary | ICD-10-CM

## 2017-04-14 NOTE — Progress Notes (Unsigned)
PFT done today. 

## 2017-04-15 ENCOUNTER — Ambulatory Visit (INDEPENDENT_AMBULATORY_CARE_PROVIDER_SITE_OTHER): Payer: Self-pay | Admitting: Emergency Medicine

## 2017-04-15 ENCOUNTER — Encounter: Payer: Self-pay | Admitting: Emergency Medicine

## 2017-04-15 VITALS — BP 130/86 | HR 92 | Ht 68.25 in | Wt 302.0 lb

## 2017-04-15 DIAGNOSIS — G4733 Obstructive sleep apnea (adult) (pediatric): Secondary | ICD-10-CM

## 2017-04-15 DIAGNOSIS — R0600 Dyspnea, unspecified: Secondary | ICD-10-CM

## 2017-04-15 DIAGNOSIS — R911 Solitary pulmonary nodule: Secondary | ICD-10-CM

## 2017-04-15 LAB — PULMONARY FUNCTION TEST
DL/VA % PRED: 95 %
DL/VA: 5.05 ml/min/mmHg/L
DLCO COR: 24.26 ml/min/mmHg
DLCO UNC % PRED: 84 %
DLCO UNC: 25.44 ml/min/mmHg
DLCO cor % pred: 80 %
FEF 25-75 PRE: 2.88 L/s
FEF 25-75 Post: 2.72 L/sec
FEF2575-%CHANGE-POST: -5 %
FEF2575-%PRED-PRE: 81 %
FEF2575-%Pred-Post: 76 %
FEV1-%CHANGE-POST: 0 %
FEV1-%PRED-PRE: 73 %
FEV1-%Pred-Post: 73 %
FEV1-POST: 2.58 L
FEV1-PRE: 2.6 L
FEV1FVC-%Change-Post: 2 %
FEV1FVC-%Pred-Pre: 99 %
FEV6-%CHANGE-POST: -3 %
FEV6-%PRED-POST: 72 %
FEV6-%PRED-PRE: 74 %
FEV6-POST: 3.04 L
FEV6-PRE: 3.14 L
FEV6FVC-%PRED-POST: 101 %
FEV6FVC-%PRED-PRE: 101 %
FVC-%Change-Post: -3 %
FVC-%Pred-Post: 71 %
FVC-%Pred-Pre: 73 %
FVC-Post: 3.04 L
FVC-Pre: 3.14 L
POST FEV6/FVC RATIO: 100 %
PRE FEV6/FVC RATIO: 100 %
Post FEV1/FVC ratio: 85 %
Pre FEV1/FVC ratio: 83 %
RV % PRED: 95 %
RV: 1.62 L
TLC % PRED: 86 %
TLC: 4.91 L

## 2017-04-15 NOTE — Patient Instructions (Addendum)
We will arrange for a home sleep study. Once this has been done we will arrange to get you your own CPAP machine.  Your breathing testing does not show asthma. This is good news.  You need to keep working on decreasing your cigarettes  Stop pulmicort Keep albuterol available to use 2 puffs if needed for breathing difficulty.  We will repeat your CT scan of the chest in October 2018 to follow pulmonary nodule.  Follow with Dr Delton CoombesByrum in October after your CT scan to review the results.

## 2017-04-15 NOTE — Progress Notes (Signed)
PFT done today. 

## 2017-04-15 NOTE — Progress Notes (Signed)
 @Patient  ID: Elizabeth Campbell, female    DOB: 03/11/1981, 36 y.o.   MRN: [id number]  Chief Complaint  Patient presents with  . Follow-up    Review PFT results    Referring provider: No ref. provider found  HPI: 36 year old [place] with a history of tobacco use, obesity. Admitted with acute on chronic respiratory failure felt to be likely related to obesity hypoventilation syndrome and bronchitis. She required BiPAP and was discharged home on oxygen. An of the chest performed 01/28/17 showed a 8 mm right upper lobe pulmonary nodule. She has a sleep study ordered that is still pending. She underwent pulmonary function testing today that I have personally reviewed. Her spirometry is most consistent with restriction without a bronchodilator response. Her lung volumes are normal and her diffusion capacity is normal. She is now off oxygen. She is using a CPAP that she got from a friend. The pressure may be 12 ??. It seems to help her, relieves intermittent chest tightness. She is working on stopping smoking, down to 5 cigarettes a day. She is on pulmicort, uses albuterol prn and feels that she benefits from it. She uses it 1x a day.     Allergies  Allergen Reactions  . Advil [Ibuprofen] Hives and Itching     There is no immunization history on file for this patient.  No past medical history on file.  Tobacco History: History  Smoking Status  . Current Every Day Smoker  . Packs/day: 0.25  . Years: 15.00  . Types: Cigarettes  Smokeless Tobacco  . Never Used   Ready to quit: Not Answered Counseling given: Not Answered   Outpatient Encounter Prescriptions as of 04/15/2017  Medication Sig  . albuterol (PROVENTIL HFA;VENTOLIN HFA) 108 (90 Base) MCG/ACT inhaler Inhale 2 puffs into the lungs every 6 (six) hours as needed for wheezing or shortness of breath.  . Budesonide (PULMICORT FLEXHALER) 90 MCG/ACT inhaler Inhale 1 puff into the lungs 2 (two) times daily.  . Cetirizine HCl (ZYRTEC  ALLERGY) 10 MG CAPS Take 1 capsule (10 mg total) by mouth daily.  . diphenhydrAMINE (BENADRYL) 25 MG tablet Take 25 mg by mouth every 6 (six) hours as needed.  . famotidine (PEPCID) 20 MG tablet Take 1 tablet (20 mg total) by mouth 2 (two) times daily.  . fluticasone (FLONASE) 50 MCG/ACT nasal spray Place 2 sprays into both nostrils daily.  . Multiple Vitamin (MULTIVITAMIN WITH MINERALS) TABS tablet Take 1 tablet by mouth daily.  . mupirocin ointment (BACTROBAN) 2 % Place 1 application into the nose 2 (two) times daily. For 5 days.  Marland Kitchen. OVER THE COUNTER MEDICATION Take 1 tablet by mouth daily.  . sodium chloride (OCEAN) 0.65 % SOLN nasal spray Place 2 sprays into both nostrils 2 (two) times daily.  Marland Kitchen. sulfamethoxazole-trimethoprim (BACTRIM DS,SEPTRA DS) 800-160 MG tablet Take 1 tablet by mouth 2 (two) times daily.  . vitamin B-12 (CYANOCOBALAMIN) 1000 MCG tablet Take 1,000 mcg by mouth daily.  . [DISCONTINUED] diphenhydrAMINE (BENADRYL) 25 mg capsule Take 1 capsule (25 mg total) by mouth daily.  . [DISCONTINUED] predniSONE (DELTASONE) 10 MG tablet Take 4 tabs for 1 day, then 3 tabs for 3 days, then 2 tabs for 3 days, then 1 tab for 3 days (Patient not taking: Reported on 03/10/2017)   No facility-administered encounter medications on file as of 04/15/2017.      Review of Systems As per HPI    Physical Exam Vitals:   04/15/17 1555 04/15/17 1600  BP:  130/86  Pulse:  92  SpO2:  92%  Weight: (!) 302 lb (137 kg)   Height: 5' 8.25" (1.734 m)    Gen: Pleasant, overwt woman, in no distress,  normal affect  ENT: No lesions,  mouth clear,  oropharynx clear, no postnasal drip  Neck: No JVD, no TMG, no carotid bruits  Lungs: No use of accessory muscles, clear without rales or rhonchi  Cardiovascular: RRR, heart sounds normal, no murmur or gallops, no peripheral edema  Musculoskeletal: No deformities, no cyanosis or clubbing  Neuro: alert, non focal  Skin: Warm, no lesions or  rashes   Assessment & Plan:   Tobacco dependence Discussed cessation with her today. She is working ng down. Once she has decreased a little further we will talk about setting a quit date.  Obesity hypoventilation syndrome (HCC) With probable sleep apnea. She is using a borrowed CPAP machine and feels that she benefits. We will perform a home sleep study, confirmed sleep apnea, and then I will start her on her own auto titration device.  Dyspnea No clear evidence for obstruction on her pulmonary function testing. Asthma although she did have an asthma-type syndrome in the setting of a viral bronchitis during her recent hospitalization. No overt evidence to support early COPD at this time. We discussed smoking cessation. I believe that we can stop the Pulmicort. She feels that she benefits clinically from albuterol and I will leave this available as needed for now. Her pulmonary function testing shows a ratio of 84%, consider possible superimposed mild obstruion.     Leslye Peer., MD 04/15/2017

## 2017-04-15 NOTE — Assessment & Plan Note (Signed)
No clear evidence for obstruction on her pulmonary function testing. Asthma although she did have an asthma-type syndrome in the setting of a viral bronchitis during her recent hospitalization. No overt evidence to support early COPD at this time. We discussed smoking cessation. I believe that we can stop the Pulmicort. She feels that she benefits clinically from albuterol and I will leave this available as needed for now. Her pulmonary function testing shows a ratio of 84%, consider possible superimposed mild obstruion.

## 2017-04-15 NOTE — Assessment & Plan Note (Signed)
With probable sleep apnea. She is using a borrowed CPAP machine and feels that she benefits. We will perform a home sleep study, confirmed sleep apnea, and then I will start her on her own auto titration device.

## 2017-04-15 NOTE — Assessment & Plan Note (Signed)
Discussed cessation with her today. She is working ng down. Once she has decreased a little further we will talk about setting a quit date.

## 2017-04-19 ENCOUNTER — Telehealth: Payer: Self-pay | Admitting: Emergency Medicine

## 2017-04-19 MED ORDER — ALBUTEROL SULFATE HFA 108 (90 BASE) MCG/ACT IN AERS: 2.0000 | INHALATION_SPRAY | Freq: Four times a day (QID) | RESPIRATORY_TRACT | 2 refills | Status: DC | PRN

## 2017-04-19 NOTE — Telephone Encounter (Signed)
Rx for Ventolin has been sent to preferred pharmacy. Pt aware and voiced her understanding. Nothing further needed.

## 2017-04-21 ENCOUNTER — Encounter: Payer: Self-pay | Admitting: Internal Medicine

## 2017-04-21 ENCOUNTER — Ambulatory Visit: Payer: Self-pay | Attending: Internal Medicine | Admitting: Internal Medicine

## 2017-04-21 VITALS — BP 133/84 | HR 77 | Temp 98.6°F | Resp 16 | Wt 302.0 lb

## 2017-04-21 DIAGNOSIS — Z2821 Immunization not carried out because of patient refusal: Secondary | ICD-10-CM

## 2017-04-21 DIAGNOSIS — I272 Pulmonary hypertension, unspecified: Secondary | ICD-10-CM | POA: Insufficient documentation

## 2017-04-21 DIAGNOSIS — Z124 Encounter for screening for malignant neoplasm of cervix: Secondary | ICD-10-CM

## 2017-04-21 DIAGNOSIS — F17200 Nicotine dependence, unspecified, uncomplicated: Secondary | ICD-10-CM

## 2017-04-21 DIAGNOSIS — Z6841 Body Mass Index (BMI) 40.0 and over, adult: Secondary | ICD-10-CM | POA: Insufficient documentation

## 2017-04-21 DIAGNOSIS — E669 Obesity, unspecified: Secondary | ICD-10-CM | POA: Insufficient documentation

## 2017-04-21 DIAGNOSIS — F17210 Nicotine dependence, cigarettes, uncomplicated: Secondary | ICD-10-CM | POA: Insufficient documentation

## 2017-04-21 DIAGNOSIS — R0600 Dyspnea, unspecified: Secondary | ICD-10-CM | POA: Insufficient documentation

## 2017-04-21 DIAGNOSIS — Z289 Immunization not carried out for unspecified reason: Secondary | ICD-10-CM

## 2017-04-21 DIAGNOSIS — Z79899 Other long term (current) drug therapy: Secondary | ICD-10-CM | POA: Insufficient documentation

## 2017-04-21 DIAGNOSIS — Z01419 Encounter for gynecological examination (general) (routine) without abnormal findings: Secondary | ICD-10-CM | POA: Insufficient documentation

## 2017-04-21 NOTE — Patient Instructions (Signed)
Continue to work on trying to quit smoking 

## 2017-04-21 NOTE — Progress Notes (Signed)
Pt is in the office today for a pap only

## 2017-04-21 NOTE — Progress Notes (Signed)
Patient ID: Elizabeth Campbell, female    DOB: Feb 21, 1981  MRN: [id number]  CC: Gynecologic Exam   Subjective: Elizabeth Campbell is a 36 y.o. female who presents for PAP Her concerns today include:  Pt with hx of PulHTN thought to be due to possible OSA, tob dep and obesity.  1. PAP/GYN G3P1 (hx of tubular pregnancy and 1 miscarriage).  Did not have tubal ligation of remaining tube (I have made this correction in [name]). -hx of abnormal pap x 1 in past.  -hx of endometriosis -hx of cyst on ovaries -menses regular but sometimes 2 x in mth.  LNMP was June 20 something. A lot of cramps and clots with periods.  Can last 4-7 days.  Heavy first 5 days.  -no vaginal disch g or itching -sexually active with one partner x 25 yrs.  2.  Sleep study scheduled as in home study this wkend  3.  Tob dep: down to 3 cig from 5 a day.  Did not call 1800-Quit Now.   Patient Active Problem List   Diagnosis Date Noted  . Dyspnea 04/15/2017  . Recurrent boils 03/10/2017  . Tobacco dependence 03/10/2017  . Class 3 severe obesity due to excess calories with serious comorbidity and body mass index (BMI) of 40.0 to 44.9 in adult (HCC) 03/10/2017  . Pulmonary hypertension (HCC) 02/09/2017  . Obesity hypoventilation syndrome (HCC)      Current Outpatient Prescriptions on File Prior to Visit  Medication Sig Dispense Refill  . albuterol (PROVENTIL HFA;VENTOLIN HFA) 108 (90 Base) MCG/ACT inhaler Inhale 2 puffs into the lungs every 6 (six) hours as needed for wheezing or shortness of breath. 1 Inhaler 2  . Budesonide (PULMICORT FLEXHALER) 90 MCG/ACT inhaler Inhale 1 puff into the lungs 2 (two) times daily. 1 Inhaler 0  . Cetirizine HCl (ZYRTEC ALLERGY) 10 MG CAPS Take 1 capsule (10 mg total) by mouth daily. 30 capsule 0  . diphenhydrAMINE (BENADRYL) 25 MG tablet Take 25 mg by mouth every 6 (six) hours as needed.    . famotidine (PEPCID) 20 MG tablet Take 1 tablet (20 mg total) by mouth 2 (two) times daily. 60  tablet 0  . fluticasone (FLONASE) 50 MCG/ACT nasal spray Place 2 sprays into both nostrils daily. 16 g 0  . Multiple Vitamin (MULTIVITAMIN WITH MINERALS) TABS tablet Take 1 tablet by mouth daily.    . mupirocin ointment (BACTROBAN) 2 % Place 1 application into the nose 2 (two) times daily. For 5 days. 22 g 0  . OVER THE COUNTER MEDICATION Take 1 tablet by mouth daily.    . sodium chloride (OCEAN) 0.65 % SOLN nasal spray Place 2 sprays into both nostrils 2 (two) times daily. 1 Bottle 0  . sulfamethoxazole-trimethoprim (BACTRIM DS,SEPTRA DS) 800-160 MG tablet Take 1 tablet by mouth 2 (two) times daily. (Patient not taking: Reported on 04/21/2017) 14 tablet 0  . vitamin B-12 (CYANOCOBALAMIN) 1000 MCG tablet Take 1,000 mcg by mouth daily.     No current facility-administered medications on file prior to visit.     Allergies  Allergen Reactions  . Advil [Ibuprofen] Hives and Itching    Social History   Social History  . Marital status: Married    Spouse name: N/A  . Number of children: N/A  . Years of education: N/A   Occupational History  . Not on file.   Social History Main Topics  . Smoking status: Current Every Day Smoker    Packs/day: 0.25  Years: 15.00    Types: Cigarettes  . Smokeless tobacco: Never Used  . Alcohol use No  . Drug use: No  . Sexual activity: No   Other Topics Concern  . Not on file   Social History Narrative  . No narrative on file    Family History  Problem Relation Age of Onset  . Hypertension Mother   . Cancer Father     Past Surgical History:  Procedure Laterality Date  . TONSILLECTOMY      ROS: Review of Systems As stated above PHYSICAL EXAM: BP 133/84   Pulse 77   Temp 98.6 F (37 C) (Oral)   Resp 16   Wt (!) 302 lb (137 kg)   SpO2 96%   BMI 45.58 kg/m   Wt Readings from Last 3 Encounters:  04/21/17 (!) 302 lb (137 kg)  04/15/17 (!) 302 lb (137 kg)  03/10/17 (!) 312 lb (141.5 kg)   Physical Exam General appearance -  alert, well appearing, obese Caucasian female and in no distress Mental status - alert, oriented to person, place, and time, normal mood, behavior, speech, dress, motor activity, and thought processes Breasts - breasts appear normal, no suspicious masses, no skin or nipple changes or axillary nodes Pelvic - normal external genitalia, vulva, vagina, cervix, uterus and adnexa.  Small skin tag LT side of mons pubis Elizabeth Campbell present for breast and pelvic exam ASSESSMENT AND PLAN: 1. Pap smear for cervical cancer screening - Cytology - PAP Pt declines STI check  2. Tobacco dependence -Commended her on progress and encouraged her to continue to cut down with goal of quitting  3. Tetanus, diphtheria, and acellular pertussis (Tdap) vaccination declined    Patient was given the opportunity to ask questions.  Patient verbalized understanding of the plan and was able to repeat key elements of the plan.   No orders of the defined types were placed in this encounter.    Requested Prescriptions    No prescriptions requested or ordered in this encounter    Return in about 3 months (around 07/22/2017).  [name], MD, FACP

## 2017-04-22 LAB — CYTOLOGY - PAP
Diagnosis: NEGATIVE
HPV (WINDOPATH): NOT DETECTED

## 2017-04-25 DIAGNOSIS — G4733 Obstructive sleep apnea (adult) (pediatric): Secondary | ICD-10-CM

## 2017-05-03 ENCOUNTER — Other Ambulatory Visit: Payer: Self-pay | Admitting: *Deleted

## 2017-05-03 DIAGNOSIS — G4733 Obstructive sleep apnea (adult) (pediatric): Secondary | ICD-10-CM

## 2017-05-13 ENCOUNTER — Other Ambulatory Visit: Payer: Self-pay

## 2017-06-10 ENCOUNTER — Telehealth: Payer: Self-pay | Admitting: Emergency Medicine

## 2017-06-10 DIAGNOSIS — G4733 Obstructive sleep apnea (adult) (pediatric): Secondary | ICD-10-CM

## 2017-06-10 NOTE — Telephone Encounter (Signed)
Called and spoke with pt and she stated that she is needing the results of the sleep study and [facility]  RB please advise of both.  Thanks

## 2017-06-15 NOTE — Telephone Encounter (Signed)
Please let her know that based on the sleep study, she had moderate level sleep apnea. We will order auto-set CPAP, range 5-20 cmH2O, heated humidity, mask best fit.

## 2017-06-15 NOTE — Telephone Encounter (Signed)
Pt is aware of results and voiced her understanding. Order has been placed her cpap therapy. Pt is wanting to know if okay to d/c nighttime O2.  RB please advise. Thanks.

## 2017-06-15 NOTE — Addendum Note (Signed)
Addended by: Maxwell MarionBLANKENSHIP, MARGIE A on: 06/15/2017 05:30 PM   Modules accepted: Orders

## 2017-06-16 NOTE — Telephone Encounter (Signed)
lmtcb for pt.   Order placed to d/c O2.  

## 2017-06-16 NOTE — Telephone Encounter (Signed)
OK to d/c for now. We will have to perform an ONO on CPAP in a few months once she is well established on the CPAP

## 2017-06-16 NOTE — Addendum Note (Signed)
Addended by: Velvet BatheAULFIELD, Kataya Guimont L on: 06/16/2017 10:22 AM   Modules accepted: Orders

## 2017-07-04 ENCOUNTER — Ambulatory Visit (INDEPENDENT_AMBULATORY_CARE_PROVIDER_SITE_OTHER)
Admission: RE | Admit: 2017-07-04 | Discharge: 2017-07-04 | Disposition: A | Discharge status: Home / Self Care | Payer: Self-pay | Source: Ambulatory Visit | Attending: Emergency Medicine | Admitting: Emergency Medicine

## 2017-07-04 DIAGNOSIS — R911 Solitary pulmonary nodule: Secondary | ICD-10-CM

## 2017-07-28 ENCOUNTER — Ambulatory Visit: Payer: Self-pay | Admitting: Internal Medicine

## 2017-08-18 ENCOUNTER — Ambulatory Visit (INDEPENDENT_AMBULATORY_CARE_PROVIDER_SITE_OTHER): Payer: Self-pay | Admitting: Emergency Medicine

## 2017-08-18 ENCOUNTER — Encounter: Payer: Self-pay | Admitting: Emergency Medicine

## 2017-08-18 VITALS — BP 116/84 | HR 94 | Ht 70.0 in | Wt 302.0 lb

## 2017-08-18 DIAGNOSIS — R911 Solitary pulmonary nodule: Secondary | ICD-10-CM | POA: Insufficient documentation

## 2017-08-18 DIAGNOSIS — G4733 Obstructive sleep apnea (adult) (pediatric): Secondary | ICD-10-CM

## 2017-08-18 DIAGNOSIS — R918 Other nonspecific abnormal finding of lung field: Secondary | ICD-10-CM

## 2017-08-18 DIAGNOSIS — Z23 Encounter for immunization: Secondary | ICD-10-CM

## 2017-08-18 NOTE — Patient Instructions (Signed)
We will reorder CPAP, 5-20 cm water.  Get started on this as soon as possible. We will probably check your overnight oxygen level on the CPAP after you have been on it for a couple of months. Flu shot today We will repeat your CT scan of the chest in April 2019, no contrast, to follow your pulmonary nodule. Follow with Dr Delton CoombesByrum in 4 months or sooner if you have any problems.

## 2017-08-18 NOTE — Assessment & Plan Note (Signed)
8 mm right upper lobe pulmonary nodule is unchanged on CT scan from 07/2017.  She needs a repeat scan without contrast in April 2019 to look for interval change.

## 2017-08-18 NOTE — Progress Notes (Signed)
 @Patient  ID: Elizabeth Campbell, female    DOB: March 11, 1981, 36 y.o.   MRN: [id number]  Chief Complaint  Patient presents with  . Follow-up    Review CT scan    Referring provider: Marcine MatarJohnson, Deborah B, MD  HPI: 36 year old [place] with a history of tobacco use, obesity. Admitted with acute on chronic respiratory failure felt to be likely related to obesity hypoventilation syndrome and bronchitis. She required BiPAP and was discharged home on oxygen. An of the chest performed 01/28/17 showed a 8 mm right upper lobe pulmonary nodule. She has a sleep study ordered that is still pending. She underwent pulmonary function testing today that I have personally reviewed. Her spirometry is most consistent with restriction without a bronchodilator response. Her lung volumes are normal and her diffusion capacity is normal. She is now off oxygen. She is using a CPAP that she got from a friend. The pressure may be 12 ??. It seems to help her, relieves intermittent chest tightness. She is working on stopping smoking, down to 5 cigarettes a day. She is on pulmicort, uses albuterol prn and feels that she benefits from it. She uses it 1x a day.   ROV 08/18/17 --follow-up visit for 36 year old woman with a history of OSA and obesity hypoventilation syndrome, restrictive lung disease by pulmonary function testing.  Her sleep apnea was confirmed on a home sleep test since our last visit.  I started her on AutoSet CPAP with a range of 5-20.  She hasn't gotten the CPAP yet due to lack of insurance - she now has coverage. She also had an 8 mm right upper lobe nodule noted during hospitalization in April.  A repeat scan was done on 07/04/17 and I have reviewed.  This shows that the nodule is unchanged in size or appearance.  There was also some mild patchy bilateral upper lobe groundglass of unclear significance.     Allergies  Allergen Reactions  . Advil [Ibuprofen] Hives and Itching     There is no immunization history on  file for this patient.  No past medical history on file.  Tobacco History: Social History   Tobacco Use  Smoking Status Current Every Day Smoker  . Packs/day: 0.25  . Years: 15.00  . Pack years: 3.75  . Types: Cigarettes  Smokeless Tobacco Never Used   Ready to quit: Not Answered Counseling given: Not Answered   Outpatient Encounter Medications as of 08/18/2017  Medication Sig  . albuterol (PROVENTIL HFA;VENTOLIN HFA) 108 (90 Base) MCG/ACT inhaler Inhale 2 puffs into the lungs every 6 (six) hours as needed for wheezing or shortness of breath.  . Budesonide (PULMICORT FLEXHALER) 90 MCG/ACT inhaler Inhale 1 puff into the lungs 2 (two) times daily.  . Cetirizine HCl (ZYRTEC ALLERGY) 10 MG CAPS Take 1 capsule (10 mg total) by mouth daily.  . diphenhydrAMINE (BENADRYL) 25 MG tablet Take 25 mg by mouth every 6 (six) hours as needed.  . famotidine (PEPCID) 20 MG tablet Take 1 tablet (20 mg total) by mouth 2 (two) times daily.  . fluticasone (FLONASE) 50 MCG/ACT nasal spray Place 2 sprays into both nostrils daily.  . Multiple Vitamin (MULTIVITAMIN WITH MINERALS) TABS tablet Take 1 tablet by mouth daily.  . mupirocin ointment (BACTROBAN) 2 % Place 1 application into the nose 2 (two) times daily. For 5 days.  Marland Kitchen. OVER THE COUNTER MEDICATION Take 1 tablet by mouth daily.  . vitamin B-12 (CYANOCOBALAMIN) 1000 MCG tablet Take 1,000 mcg by mouth daily.  . [  DISCONTINUED] sodium chloride (OCEAN) 0.65 % SOLN nasal spray Place 2 sprays into both nostrils 2 (two) times daily.  . [DISCONTINUED] sulfamethoxazole-trimethoprim (BACTRIM DS,SEPTRA DS) 800-160 MG tablet Take 1 tablet by mouth 2 (two) times daily. (Patient not taking: Reported on 04/21/2017)   No facility-administered encounter medications on file as of 08/18/2017.      Review of Systems As per HPI    Physical Exam Vitals:   08/18/17 0932  BP: 116/84  Pulse: 94  SpO2: 94%  Weight: (!) 302 lb (137 kg)  Height: 5\' 10"  (1.778 m)    Gen: Pleasant, overwt woman, in no distress,  normal affect  ENT: No lesions,  mouth clear,  oropharynx clear, no postnasal drip  Neck: No JVD, no TMG, no carotid bruits  Lungs: No use of accessory muscles, clear without rales or rhonchi  Cardiovascular: RRR, heart sounds normal, no murmur or gallops, no peripheral edema  Musculoskeletal: No deformities, no cyanosis or clubbing  Neuro: alert, non focal  Skin: Warm, no lesions or rashes   Assessment & Plan:   Obstructive sleep apnea Documented on home sleep study.  We ordered AutoSet CPAP but she was unable to start because she did not have insurance.  She has now obtained insurance and wants to get started.  We will reorder with AutoSet 5-20 cm water.  Follow-up in about 3 months to confirm compliance and efficacy.  She will need an overnight oximetry at some point on CPAP to ensure that she does not desaturate.  Tobacco dependence Discussed that she needs to work on cessation  Pulmonary nodule, right 8 mm right upper lobe pulmonary nodule is unchanged on CT scan from 07/2017.  She needs a repeat scan without contrast in April 2019 to look for interval change.   Levy Pupaobert Notnamed Croucher, MD, PhD 08/18/2017, 9:53 AM [facility] [phone] or if no answer [phone]

## 2017-08-18 NOTE — Assessment & Plan Note (Signed)
Discussed that she needs to work on cessation

## 2017-08-18 NOTE — Assessment & Plan Note (Signed)
Documented on home sleep study.  We ordered AutoSet CPAP but she was unable to start because she did not have insurance.  She has now obtained insurance and wants to get started.  We will reorder with AutoSet 5-20 cm water.  Follow-up in about 3 months to confirm compliance and efficacy.  She will need an overnight oximetry at some point on CPAP to ensure that she does not desaturate.

## 2017-08-23 ENCOUNTER — Ambulatory Visit: Payer: Self-pay | Admitting: Emergency Medicine

## 2018-01-02 ENCOUNTER — Inpatient Hospital Stay: Admission: RE | Admit: 2018-01-02 | Payer: Self-pay | Source: Ambulatory Visit

## 2018-01-23 ENCOUNTER — Inpatient Hospital Stay: Admission: RE | Admit: 2018-01-23 | Payer: Self-pay | Source: Ambulatory Visit

## 2018-05-04 ENCOUNTER — Telehealth: Payer: Self-pay | Admitting: Internal Medicine

## 2018-05-04 NOTE — Telephone Encounter (Signed)
Pt called back to check on the update of her request for a prescription

## 2018-05-04 NOTE — Telephone Encounter (Signed)
Will forward to pcp

## 2018-05-04 NOTE — Telephone Encounter (Signed)
Patient called stating that sore have appeared in between her legs and armpits. Patient states that her PCP has treated her before and would like to know if she can be prescribed an Rx. [facility] [address]

## 2018-05-05 NOTE — Telephone Encounter (Signed)
Elizabeth BodoQuianna Could you schedule pt an appointment with next available provider or walkin

## 2018-05-05 NOTE — Telephone Encounter (Signed)
Called patient but patient didn't answer. lvm for patient to call back

## 2018-05-10 ENCOUNTER — Ambulatory Visit: Payer: Self-pay | Attending: Family Medicine | Admitting: Physician Assistant

## 2018-05-10 VITALS — BP 112/76 | HR 88 | Temp 98.3°F | Resp 16 | Wt 320.0 lb

## 2018-05-10 DIAGNOSIS — Z131 Encounter for screening for diabetes mellitus: Secondary | ICD-10-CM | POA: Insufficient documentation

## 2018-05-10 DIAGNOSIS — Z79899 Other long term (current) drug therapy: Secondary | ICD-10-CM | POA: Insufficient documentation

## 2018-05-10 DIAGNOSIS — Z886 Allergy status to analgesic agent status: Secondary | ICD-10-CM | POA: Insufficient documentation

## 2018-05-10 DIAGNOSIS — L732 Hidradenitis suppurativa: Secondary | ICD-10-CM | POA: Insufficient documentation

## 2018-05-10 LAB — GLUCOSE, POCT (MANUAL RESULT ENTRY): POC GLUCOSE: 97 mg/dL (ref 70–99)

## 2018-05-10 MED ORDER — FLUCONAZOLE 150 MG PO TABS: 150.0000 mg | ORAL_TABLET | Freq: Once | ORAL | 0 refills | Status: AC

## 2018-05-10 MED ORDER — DOXYCYCLINE HYCLATE 100 MG PO TABS: 100.0000 mg | ORAL_TABLET | Freq: Two times a day (BID) | ORAL | 0 refills | Status: DC

## 2018-05-10 MED ORDER — MUPIROCIN 2 % EX OINT: 1.0000 "application " | TOPICAL_OINTMENT | Freq: Two times a day (BID) | CUTANEOUS | 0 refills | Status: DC

## 2018-05-10 NOTE — Patient Instructions (Signed)
Hidradenitis Suppurativa Hidradenitis suppurativa is a long-term (chronic) skin disease that starts with blocked sweat glands or hair follicles. Bacteria may grow in these blocked openings of your skin. Hidradenitis suppurativa is like a severe form of acne that develops in areas of your body where acne would be unusual. It is most likely to affect the areas of your body where skin rubs against skin and becomes moist. This includes your:  Underarms.  Groin.  Genital areas.  Buttocks.  Upper thighs.  Breasts.  Hidradenitis suppurativa may start out with small pimples. The pimples can develop into deep sores that break open (rupture) and drain pus. Over time your skin may thicken and become scarred. Hidradenitis suppurativa cannot be passed from person to person. What are the causes? The exact cause of hidradenitis suppurativa is not known. This condition may be due to:  Female and female hormones. The condition is rare before and after puberty.  An overactive body defense system (immune system). Your immune system may overreact to the blocked hair follicles or sweat glands and cause swelling and pus-filled sores.  What increases the risk? You may have a higher risk of hidradenitis suppurativa if you:  Are a woman.  Are between ages 11 and 55.  Have a family history of hidradenitis suppurativa.  Have a personal history of acne.  Are overweight.  Smoke.  Take the drug lithium.  What are the signs or symptoms? The first signs of an outbreak are usually painful skin bumps that look like pimples. As the condition progresses:  Skin bumps may get bigger and grow deeper into the skin.  Bumps under the skin may rupture and drain smelly pus.  Skin may become itchy and infected.  Skin may thicken and scar.  Drainage may continue through tunnels under the skin (fistulas).  Walking and moving your arms can become painful.  How is this diagnosed? Your health care provider may  diagnose hidradenitis suppurativa based on your medical history and your signs and symptoms. A physical exam will also be done. You may need to see a health care provider who specializes in skin diseases (dermatologist). You may also have tests done to confirm the diagnosis. These can include:  Swabbing a sample of pus or drainage from your skin so it can be sent to the lab and tested for infection.  Blood tests to check for infection.  How is this treated? The same treatment will not work for everybody with hidradenitis suppurativa. Your treatment will depend on how severe your symptoms are. You may need to try several treatments to find what works best for you. Part of your treatment may include cleaning and bandaging (dressing) your wounds. You may also have to take medicines, such as the following:  Antibiotics.  Acne medicines.  Medicines to block or suppress the immune system.  A diabetes medicine (metformin) is sometimes used to treat this condition.  For women, birth control pills can sometimes help relieve symptoms.  You may need surgery if you have a severe case of hidradenitis suppurativa that does not respond to medicine. Surgery may involve:  Using a laser to clear the skin and remove hair follicles.  Opening and draining deep sores.  Removing the areas of skin that are diseased and scarred.  Follow these instructions at home:  Learn as much as you can about your disease, and work closely with your health care providers.  Take medicines only as directed by your health care provider.  If you were prescribed   an antibiotic medicine, finish it all even if you start to feel better.  If you are overweight, losing weight may be very helpful. Try to reach and maintain a healthy weight.  Do not use any tobacco products, including cigarettes, chewing tobacco, or electronic cigarettes. If you need help quitting, ask your health care provider.  Do not shave the areas where you  get hidradenitis suppurativa.  Do not wear deodorant.  Wear loose-fitting clothes.  Try not to overheat and get sweaty.  Take a daily bleach bath as directed by your health care provider. ? Fill your bathtub halfway with water. ? Pour in  cup of unscented household bleach. ? Soak for 5-10 minutes.  Cover sore areas with a warm, clean washcloth (compress) for 5-10 minutes. Contact a health care provider if:  You have a flare-up of hidradenitis suppurativa.  You have chills or a fever.  You are having trouble controlling your symptoms at home. This information is not intended to replace advice given to you by your health care provider. Make sure you discuss any questions you have with your health care provider. Document Released: 05/04/2004 Document Revised: 02/26/2016 Document Reviewed: 12/21/2013 Elsevier Interactive Patient Education  2018 Elsevier Inc.  

## 2018-05-10 NOTE — Progress Notes (Signed)
Patient ID: Elizabeth Campbell, female   DOB: 08/09/1981, 37 y.o.   MRN: [id number]   Elizabeth Campbell, is a 37 y.o. female  [id number]  [id number]  DOB - Jan 11, 1981  Subjective:  Chief Complaint and HPI: Elizabeth Campbell is a 37 y.o. female here today for recurrent boils.  Sometimes become infected.  Usually axilla and inguinal region affected.  No f/c.     ROS:   Constitutional:  No f/c, No night sweats, No unexplained weight loss. EENT:  No vision changes, No blurry vision, No hearing changes. No mouth, throat, or ear problems.  Respiratory: No cough, No SOB Cardiac: No CP, no palpitations GI:  No abd pain, No N/V/D. GU: No Urinary s/sx Musculoskeletal: No joint pain Neuro: No headache, no dizziness, no motor weakness.  Skin: +boils Endocrine:  No polydipsia. No polyuria.  Psych: Denies SI/HI  No problems updated.  ALLERGIES: Allergies  Allergen Reactions  . Advil [Ibuprofen] Hives and Itching    PAST MEDICAL HISTORY: History reviewed. No pertinent past medical history.  MEDICATIONS AT HOME: Prior to Admission medications   Medication Sig Start Date End Date Taking? Authorizing Provider  albuterol (PROVENTIL HFA;VENTOLIN HFA) 108 (90 Base) MCG/ACT inhaler Inhale 2 puffs into the lungs every 6 (six) hours as needed for wheezing or shortness of breath. 04/19/17   Leslye Peer, MD  Budesonide (PULMICORT FLEXHALER) 90 MCG/ACT inhaler Inhale 1 puff into the lungs 2 (two) times daily. 01/31/17   Noralee Stain, DO  Cetirizine HCl (ZYRTEC ALLERGY) 10 MG CAPS Take 1 capsule (10 mg total) by mouth daily. 01/31/17   Noralee Stain, DO  diphenhydrAMINE (BENADRYL) 25 MG tablet Take 25 mg by mouth every 6 (six) hours as needed.    [provider]  doxycycline (VIBRA-TABS) 100 MG tablet Take 1 tablet (100 mg total) by mouth 2 (two) times daily. 05/10/18   Anders Simmonds, PA-C  famotidine (PEPCID) 20 MG tablet Take 1 tablet (20 mg total) by mouth 2 (two) times daily.  01/31/17   Noralee Stain, DO  fluconazole (DIFLUCAN) 150 MG tablet Take 1 tablet (150 mg total) by mouth once for 1 dose. 05/10/18 05/10/18  Anders Simmonds, PA-C  fluticasone (FLONASE) 50 MCG/ACT nasal spray Place 2 sprays into both nostrils daily. 01/31/17   Noralee Stain, DO  Multiple Vitamin (MULTIVITAMIN WITH MINERALS) TABS tablet Take 1 tablet by mouth daily.    [provider]  mupirocin ointment (BACTROBAN) 2 % Place 1 application into the nose 2 (two) times daily. For 5 days. 03/10/17   Marcine Matar, MD  mupirocin ointment (BACTROBAN) 2 % Place 1 application into the nose 2 (two) times daily. 05/10/18   Anders Simmonds, PA-C  OVER THE COUNTER MEDICATION Take 1 tablet by mouth daily.    [provider]  vitamin B-12 (CYANOCOBALAMIN) 1000 MCG tablet Take 1,000 mcg by mouth daily.    [provider]     Objective:  EXAM:   Vitals:   05/10/18 1044  BP: 112/76  Pulse: 88  Resp: 16  Temp: 98.3 F (36.8 C)  TempSrc: Oral  SpO2: 95%  Weight: (!) 320 lb (145.2 kg)    General appearance : A&OX3. NAD. Non-toxic-appearing HEENT: Atraumatic and Normocephalic.  PERRLA. EOM intact.   Neck:  No AC nodes, no thyromegaly Chest/Lungs:  Breathing-non-labored, Good air entry bilaterally, breath sounds normal without rales, rhonchi, or wheezing  CVS: S1 S2 regular, no murmurs, gallops, rubs  Extremities: Bilateral Lower Ext shows no  edema, both legs are warm to touch with = pulse throughout Neurology:  CN II-XII grossly intact, Non focal.   Psych:  TP linear. J/I WNL. Normal speech. Appropriate eye contact and affect.  Skin:  Several fluctuant and tender boils <1cm in axilla and groin B.  None with induration or in need of I&D.    Data Review Lab Results  Component Value Date   HGBA1C 5.9 (H) 01/30/2017     Assessment & Plan   1. Diabetes mellitus screening Blood sugar normal - Glucose (CBG)  2. Hydradenitis Discussed skin care, smoking cessation,  increase water intake and gave educational information on the condition. - doxycycline (VIBRA-TABS) 100 MG tablet; Take 1 tablet (100 mg total) by mouth 2 (two) times daily.  Dispense: 20 tablet; Refill: 0 - mupirocin ointment (BACTROBAN) 2 %; Place 1 application into the nose 2 (two) times daily.  Dispense: 22 g; Refill: 0 - fluconazole (DIFLUCAN) 150 MG tablet; Take 1 tablet (150 mg total) by mouth once for 1 dose.  Dispense: 1 tablet; Refill: 0   Patient have been counseled extensively about nutrition and exercise  Return in about 3 months (around 08/10/2018) for CPE and fasting bloodwork.  The patient was given clear instructions to go to ER or return to medical center if symptoms don't improve, worsen or new problems develop. The patient verbalized understanding. The patient was told to call to get lab results if they haven't heard anything in the next week.     Georgian CoAngela McClung, PA-C [facility] [place], [place] [phone]   05/10/2018, 10:53 AM

## 2018-08-10 ENCOUNTER — Other Ambulatory Visit: Payer: Self-pay

## 2018-08-10 ENCOUNTER — Ambulatory Visit: Payer: Self-pay | Admitting: Internal Medicine

## 2019-06-20 ENCOUNTER — Encounter (HOSPITAL_COMMUNITY): Payer: Self-pay | Admitting: Internal Medicine

## 2019-06-20 ENCOUNTER — Inpatient Hospital Stay (HOSPITAL_COMMUNITY)
Admission: EM | Admit: 2019-06-20 | Discharge: 2019-06-23 | DRG: 641 | Disposition: A | Discharge status: Home / Self Care | Payer: Self-pay | Attending: Internal Medicine | Admitting: Internal Medicine

## 2019-06-20 ENCOUNTER — Other Ambulatory Visit: Payer: Self-pay

## 2019-06-20 DIAGNOSIS — R7303 Prediabetes: Secondary | ICD-10-CM | POA: Diagnosis present

## 2019-06-20 DIAGNOSIS — E669 Obesity, unspecified: Secondary | ICD-10-CM | POA: Diagnosis present

## 2019-06-20 DIAGNOSIS — R29898 Other symptoms and signs involving the musculoskeletal system: Secondary | ICD-10-CM | POA: Diagnosis present

## 2019-06-20 DIAGNOSIS — R739 Hyperglycemia, unspecified: Secondary | ICD-10-CM | POA: Diagnosis present

## 2019-06-20 DIAGNOSIS — E739 Lactose intolerance, unspecified: Secondary | ICD-10-CM | POA: Diagnosis present

## 2019-06-20 DIAGNOSIS — E873 Alkalosis: Secondary | ICD-10-CM | POA: Diagnosis present

## 2019-06-20 DIAGNOSIS — E7439 Other disorders of intestinal carbohydrate absorption: Secondary | ICD-10-CM

## 2019-06-20 DIAGNOSIS — E662 Morbid (severe) obesity with alveolar hypoventilation: Secondary | ICD-10-CM | POA: Diagnosis present

## 2019-06-20 DIAGNOSIS — Z6841 Body Mass Index (BMI) 40.0 and over, adult: Secondary | ICD-10-CM

## 2019-06-20 DIAGNOSIS — E860 Dehydration: Secondary | ICD-10-CM | POA: Diagnosis present

## 2019-06-20 DIAGNOSIS — L732 Hidradenitis suppurativa: Secondary | ICD-10-CM | POA: Insufficient documentation

## 2019-06-20 DIAGNOSIS — F17210 Nicotine dependence, cigarettes, uncomplicated: Secondary | ICD-10-CM | POA: Diagnosis present

## 2019-06-20 DIAGNOSIS — E876 Hypokalemia: Principal | ICD-10-CM | POA: Diagnosis present

## 2019-06-20 DIAGNOSIS — Z9119 Patient's noncompliance with other medical treatment and regimen: Secondary | ICD-10-CM

## 2019-06-20 DIAGNOSIS — E66813 Obesity, class 3: Secondary | ICD-10-CM

## 2019-06-20 DIAGNOSIS — Z79899 Other long term (current) drug therapy: Secondary | ICD-10-CM

## 2019-06-20 DIAGNOSIS — Z20828 Contact with and (suspected) exposure to other viral communicable diseases: Secondary | ICD-10-CM | POA: Diagnosis present

## 2019-06-20 DIAGNOSIS — R531 Weakness: Secondary | ICD-10-CM | POA: Insufficient documentation

## 2019-06-20 DIAGNOSIS — Z720 Tobacco use: Secondary | ICD-10-CM | POA: Diagnosis present

## 2019-06-20 DIAGNOSIS — G723 Periodic paralysis: Secondary | ICD-10-CM | POA: Diagnosis present

## 2019-06-20 DIAGNOSIS — G4733 Obstructive sleep apnea (adult) (pediatric): Secondary | ICD-10-CM | POA: Diagnosis present

## 2019-06-20 DIAGNOSIS — Z7951 Long term (current) use of inhaled steroids: Secondary | ICD-10-CM

## 2019-06-20 DIAGNOSIS — I272 Pulmonary hypertension, unspecified: Secondary | ICD-10-CM | POA: Diagnosis present

## 2019-06-20 DIAGNOSIS — N179 Acute kidney failure, unspecified: Secondary | ICD-10-CM | POA: Diagnosis present

## 2019-06-20 DIAGNOSIS — D72829 Elevated white blood cell count, unspecified: Secondary | ICD-10-CM | POA: Diagnosis present

## 2019-06-20 HISTORY — DX: Obesity, unspecified: E66.9

## 2019-06-20 HISTORY — DX: Hypomagnesemia: E83.42

## 2019-06-20 HISTORY — DX: Hypokalemia: E87.6

## 2019-06-20 HISTORY — DX: Tobacco use: Z72.0

## 2019-06-20 HISTORY — DX: Hidradenitis suppurativa: L73.2

## 2019-06-20 HISTORY — DX: Morbid (severe) obesity with alveolar hypoventilation: E66.2

## 2019-06-20 LAB — CBC
HCT: 44.5 % (ref 36.0–46.0)
Hemoglobin: 14.1 g/dL (ref 12.0–15.0)
MCH: 28.5 pg (ref 26.0–34.0)
MCHC: 31.7 g/dL (ref 30.0–36.0)
MCV: 89.9 fL (ref 80.0–100.0)
Platelets: 336 10*3/uL (ref 150–400)
RBC: 4.95 MIL/uL (ref 3.87–5.11)
RDW: 16.6 % — ABNORMAL HIGH (ref 11.5–15.5)
WBC: 14.7 10*3/uL — ABNORMAL HIGH (ref 4.0–10.5)
nRBC: 0 % (ref 0.0–0.2)

## 2019-06-20 LAB — URINALYSIS, ROUTINE W REFLEX MICROSCOPIC
Bacteria, UA: NONE SEEN
Bilirubin Urine: NEGATIVE
Glucose, UA: NEGATIVE mg/dL
Ketones, ur: NEGATIVE mg/dL
Leukocytes,Ua: NEGATIVE
Nitrite: NEGATIVE
Protein, ur: 30 mg/dL — AB
Specific Gravity, Urine: 1.006 (ref 1.005–1.030)
pH: 6 (ref 5.0–8.0)

## 2019-06-20 LAB — CBG MONITORING, ED: Glucose-Capillary: 141 mg/dL — ABNORMAL HIGH (ref 70–99)

## 2019-06-20 LAB — BASIC METABOLIC PANEL
Anion gap: 14 (ref 5–15)
BUN: 10 mg/dL (ref 6–20)
CO2: 22 mmol/L (ref 22–32)
Calcium: 8.6 mg/dL — ABNORMAL LOW (ref 8.9–10.3)
Chloride: 101 mmol/L (ref 98–111)
Creatinine, Ser: 1.26 mg/dL — ABNORMAL HIGH (ref 0.44–1.00)
GFR calc Af Amer: >60 mL/min (ref >60)
GFR calc non Af Amer: 54 mL/min — ABNORMAL LOW (ref >60)
Glucose, Bld: 164 mg/dL — ABNORMAL HIGH (ref 70–99)
Potassium: <2 mmol/L — CL (ref 3.5–5.1)
Sodium: 137 mmol/L (ref 135–145)

## 2019-06-20 LAB — TROPONIN I (HIGH SENSITIVITY)
Troponin I (High Sensitivity): 15 ng/L (ref <18)
Troponin I (High Sensitivity): 15 ng/L (ref <18)

## 2019-06-20 LAB — TSH
TSH: 2.093 u[IU]/mL (ref 0.350–4.500)
TSH: 1.206 u[IU]/mL (ref 0.350–4.500)

## 2019-06-20 LAB — SARS CORONAVIRUS 2 BY RT PCR (HOSPITAL ORDER, PERFORMED IN ~~LOC~~ HOSPITAL LAB): SARS Coronavirus 2: NEGATIVE

## 2019-06-20 LAB — MAGNESIUM: Magnesium: 1.7 mg/dL (ref 1.7–2.4)

## 2019-06-20 LAB — PHOSPHORUS: Phosphorus: 1.7 mg/dL — ABNORMAL LOW (ref 2.5–4.6)

## 2019-06-20 LAB — I-STAT BETA HCG BLOOD, ED (MC, WL, AP ONLY): I-stat hCG, quantitative: <5 m[IU]/mL (ref <5)

## 2019-06-20 MED ORDER — ENOXAPARIN SODIUM 40 MG/0.4ML ~~LOC~~ SOLN
40.0000 mg | SUBCUTANEOUS | Status: DC
  Administered 2019-06-20 – 2019-06-21 (×2): 40 mg via SUBCUTANEOUS
  Filled 2019-06-20 (×2): qty 0.4

## 2019-06-20 MED ORDER — ONDANSETRON HCL 4 MG/2ML IJ SOLN: 4.0000 mg | Freq: Four times a day (QID) | INTRAMUSCULAR | Status: DC | PRN

## 2019-06-20 MED ORDER — MELATONIN 3 MG PO TABS
9.0000 mg | ORAL_TABLET | Freq: Every evening | ORAL | Status: DC | PRN
  Administered 2019-06-20 – 2019-06-23 (×3): 9 mg via ORAL
  Filled 2019-06-20 (×5): qty 3

## 2019-06-20 MED ORDER — POTASSIUM CHLORIDE CRYS ER 20 MEQ PO TBCR
80.0000 meq | EXTENDED_RELEASE_TABLET | Freq: Once | ORAL | Status: AC
  Administered 2019-06-20: 80 meq via ORAL
  Filled 2019-06-20: qty 4

## 2019-06-20 MED ORDER — MAGNESIUM SULFATE 2 GM/50ML IV SOLN
2.0000 g | Freq: Once | INTRAVENOUS | Status: AC
  Administered 2019-06-20: 2 g via INTRAVENOUS
  Filled 2019-06-20: qty 50

## 2019-06-20 MED ORDER — SODIUM CHLORIDE 0.9% FLUSH
3.0000 mL | Freq: Once | INTRAVENOUS | Status: AC
  Administered 2019-06-20: 3 mL via INTRAVENOUS

## 2019-06-20 MED ORDER — POTASSIUM CHLORIDE IN NACL 40-0.9 MEQ/L-% IV SOLN
INTRAVENOUS | Status: DC
  Administered 2019-06-20 – 2019-06-21 (×2): 100 mL/h via INTRAVENOUS
  Administered 2019-06-22 – 2019-06-23 (×2): 75 mL/h via INTRAVENOUS
  Filled 2019-06-20 (×5): qty 1000

## 2019-06-20 MED ORDER — POTASSIUM CHLORIDE 10 MEQ/100ML IV SOLN
10.0000 meq | Freq: Once | INTRAVENOUS | Status: AC
  Administered 2019-06-20: 10 meq via INTRAVENOUS
  Filled 2019-06-20: qty 100

## 2019-06-20 MED ORDER — ONDANSETRON HCL 4 MG PO TABS: 4.0000 mg | ORAL_TABLET | Freq: Four times a day (QID) | ORAL | Status: DC | PRN

## 2019-06-20 MED ORDER — ACETAMINOPHEN 650 MG RE SUPP: 650.0000 mg | Freq: Four times a day (QID) | RECTAL | Status: DC | PRN

## 2019-06-20 MED ORDER — ACETAMINOPHEN 325 MG PO TABS
650.0000 mg | ORAL_TABLET | Freq: Four times a day (QID) | ORAL | Status: DC | PRN
  Administered 2019-06-20 – 2019-06-22 (×3): 650 mg via ORAL
  Filled 2019-06-20 (×3): qty 2

## 2019-06-20 NOTE — ED Notes (Signed)
Ronalee Belts, RN on 3E given report

## 2019-06-20 NOTE — ED Notes (Signed)
Pt eating dinner tray °

## 2019-06-20 NOTE — ED Provider Notes (Signed)
[facility] EMERGENCY DEPARTMENT Provider Note   CSN: [id number] Arrival date & time: 06/20/19  1245     History   Chief Complaint Chief Complaint  Patient presents with  . Weakness    HPI Elizabeth Campbell is a 38 y.o. female.     Patient is a 38 year old female with history of pulmonary hypertension, sleep apnea, and obesity.  She presents today for evaluation of weakness.  This began yesterday and is rapidly worsening.  Patient states that this morning she was unable to ambulate due to weakness in her legs.  She denies any vomiting or diarrhea.  She denies any other symptoms.  The history is provided by the patient.  Weakness Severity:  Moderate Onset quality:  Sudden Duration:  24 hours Timing:  Constant Progression:  Worsening Chronicity:  New Relieved by:  Nothing Worsened by:  Nothing Ineffective treatments:  None tried   No past medical history on file.  Patient Active Problem List   Diagnosis Date Noted  . Pulmonary nodule, right 08/18/2017  . Dyspnea 04/15/2017  . Recurrent boils 03/10/2017  . Tobacco dependence 03/10/2017  . Class 3 severe obesity due to excess calories with serious comorbidity and body mass index (BMI) of 40.0 to 44.9 in adult ([place]) 03/10/2017  . Pulmonary hypertension ([place]) 02/09/2017  . Obstructive sleep apnea     Past Surgical History:  Procedure Laterality Date  . TONSILLECTOMY       OB History   No obstetric history on file.      Home Medications    Prior to Admission medications   Medication Sig Start Date End Date Taking? Authorizing Provider  albuterol (PROVENTIL HFA;VENTOLIN HFA) 108 (90 Base) MCG/ACT inhaler Inhale 2 puffs into the lungs every 6 (six) hours as needed for wheezing or shortness of breath. 04/19/17   Collene Gobble, MD  Budesonide (PULMICORT FLEXHALER) 90 MCG/ACT inhaler Inhale 1 puff into the lungs 2 (two) times daily. 01/31/17   Dessa Phi, DO  Cetirizine HCl (ZYRTEC ALLERGY) 10  MG CAPS Take 1 capsule (10 mg total) by mouth daily. 01/31/17   Dessa Phi, DO  diphenhydrAMINE (BENADRYL) 25 MG tablet Take 25 mg by mouth every 6 (six) hours as needed.    [provider]  doxycycline (VIBRA-TABS) 100 MG tablet Take 1 tablet (100 mg total) by mouth 2 (two) times daily. 05/10/18   Argentina Donovan, PA-C  famotidine (PEPCID) 20 MG tablet Take 1 tablet (20 mg total) by mouth 2 (two) times daily. 01/31/17   Dessa Phi, DO  fluticasone (FLONASE) 50 MCG/ACT nasal spray Place 2 sprays into both nostrils daily. 01/31/17   Dessa Phi, DO  Multiple Vitamin (MULTIVITAMIN WITH MINERALS) TABS tablet Take 1 tablet by mouth daily.    [provider]  mupirocin ointment (BACTROBAN) 2 % Place 1 application into the nose 2 (two) times daily. For 5 days. 03/10/17   Ladell Pier, MD  mupirocin ointment (BACTROBAN) 2 % Place 1 application into the nose 2 (two) times daily. 05/10/18   Argentina Donovan, PA-C  OVER THE COUNTER MEDICATION Take 1 tablet by mouth daily.    [provider]  vitamin B-12 (CYANOCOBALAMIN) 1000 MCG tablet Take 1,000 mcg by mouth daily.    [provider]    Family History Family History  Problem Relation Age of Onset  . Hypertension Mother   . Cancer Father     Social History Social History   Tobacco Use  .  Smoking status: Current Every Day Smoker    Packs/day: 0.25    Years: 15.00    Pack years: 3.75    Types: Cigarettes  . Smokeless tobacco: Never Used  Substance Use Topics  . Alcohol use: No  . Drug use: No     Allergies   Advil [ibuprofen]   Review of Systems Review of Systems  Neurological: Positive for weakness.  All other systems reviewed and are negative.    Physical Exam Updated Vital Signs BP 122/67 (BP Location: Left Arm)   Pulse 99   Temp 98 F (36.7 C) (Oral)   Resp 18   SpO2 96%   Physical Exam Vitals signs and nursing note reviewed.  Constitutional:      General: She is not in  acute distress.    Appearance: She is well-developed. She is not diaphoretic.  HENT:     Head: Normocephalic and atraumatic.  Neck:     Musculoskeletal: Normal range of motion and neck supple.  Cardiovascular:     Rate and Rhythm: Normal rate and regular rhythm.     Heart sounds: No murmur. No friction rub. No gallop.   Pulmonary:     Effort: Pulmonary effort is normal. No respiratory distress.     Breath sounds: Normal breath sounds. No wheezing.  Abdominal:     General: Bowel sounds are normal. There is no distension.     Palpations: Abdomen is soft.     Tenderness: There is no abdominal tenderness.  Musculoskeletal: Normal range of motion.  Skin:    General: Skin is warm and dry.  Neurological:     General: No focal deficit present.     Mental Status: She is alert and oriented to person, place, and time.     Cranial Nerves: No cranial nerve deficit.     Motor: Weakness present.     Coordination: Coordination normal.     Comments: DTRs are 1+ and symmetrical in the patellar and Achilles tendons bilaterally.  Patient does have decreased strength in her legs and has difficulty raising her legs off of the bed.  Sensation is intact.      ED Treatments / Results  Labs (all labs ordered are listed, but only abnormal results are displayed) Labs Reviewed  BASIC METABOLIC PANEL  CBC  URINALYSIS, ROUTINE W REFLEX MICROSCOPIC  I-STAT BETA HCG BLOOD, ED ([facility], [facility], [facility] ONLY)  CBG MONITORING, ED    EKG ED ECG REPORT   Date: 06/20/2019  Rate: 104  Rhythm: sinus tachycardia  QRS Axis: normal  Intervals: QT prolongation  ST/T Wave abnormalities: nonspecific T wave changes  Conduction Disutrbances:none  Narrative Interpretation:   Old EKG Reviewed: changes noted  I have personally reviewed the EKG tracing and agree with the computerized printout as noted.   Radiology No results found.  Procedures Procedures (including critical care time)  Medications Ordered in ED  Medications  sodium chloride flush (NS) 0.9 % injection 3 mL (has no administration in time range)     Initial Impression / Assessment and Plan / ED Course  I have reviewed the triage vital signs and the nursing notes.  Pertinent labs & imaging results that were available during my care of the patient were reviewed by me and considered in my medical decision making (see chart for details).  Patient presenting here with complaints of weakness that began abruptly this morning.  Patient states that she is having such as weakness in her legs that she cannot ambulate.  Her physical examination does reveal weakness when she attempts to lift her legs, however her reflexes are present and sensation is intact to all extremities.  Patient's initial EKG reveals a sinus tachycardia with diffuse, nonspecific T wave abnormalities and QT prolongation.  Laboratory studies reveal a potassium less than 2.0.  I suspect that this hypokalemia is the cause of her symptoms and EKG changes.  Patient will be given IV and oral potassium replacement and admitted to the hospitalist service.  CRITICAL CARE Performed by: Geoffery Lyons Total critical care time: 35 minutes Critical care time was exclusive of separately billable procedures and treating other patients. Critical care was necessary to treat or prevent imminent or life-threatening deterioration. Critical care was time spent personally by me on the following activities: development of treatment plan with patient and/or surrogate as well as nursing, discussions with consultants, evaluation of patient's response to treatment, examination of patient, obtaining history from patient or surrogate, ordering and performing treatments and interventions, ordering and review of laboratory studies, ordering and review of radiographic studies, pulse oximetry and re-evaluation of patient's condition.   Final Clinical Impressions(s) / ED Diagnoses   Final diagnoses:  None     ED Discharge Orders    None       Geoffery Lyons, MD 06/20/19 1442

## 2019-06-20 NOTE — ED Notes (Signed)
Dinner tray ordered.

## 2019-06-20 NOTE — H&P (Signed)
History and Physical    Elizabeth Campbell ZOX:[id number] DOB: [birth year] DOA: 06/20/2019  PCP: Marcine MatarJohnson, Deborah B, MD  Patient coming from: Home  I have personally briefly reviewed patient's [facility] medical [facility]  Chief Complaint: Weakness in bilateral lower extremities started this morning  HPI: Elizabeth Campbell is a 38 y.o. female with medical history significant of obesity, tobacco abuse, sleep apnea (noncompliant with CPAP machine) obesity hypoventilation syndrome presents to emergency department due to weakness in bilateral lower extremities and achiness started this morning.  Reports that at 11:30 AM she went to bathroom, was unable to move her legs so she called her mom who called 911 and brought patient to the emergency department for further evaluation and management.  She denies recent upper respiratory symptoms, diarrhea, high carbohydrate meals, family history of paralysis, previous history of similar symptoms, trauma, fever, chills, nausea, vomiting, diarrhea, abdominal pain, decreased appetite, decreased oral intake, headache, blurry vision, chest pain, shortness of breath, palpitation, lightheadedness, dizziness, seizures, head trauma, urinary or bowel incontinence.   Has history of sleep apnea however she is noncompliant with CPAP machine as she does not have machine at home.  She does not take any medications such as diuretics, beta agonist inhalers.  Reports she only takes Tylenol as needed.  She drinks 4 to 5 cans of Dr. Reino KentPepper per day, smokes half pack of cigarettes per day, denies alcohol, illicit drug use.  She lives with her mom at home and independent on daily life activities.  She is CNA at [employer].  ED Course: Patient was found to have severe hypokalemia with potassium level less than 2.  Her potassium was replaced.  Patient alert and oriented x3, communicating well, unable to move her bilateral lower extremities.   Review of Systems: As per  HPI otherwise negative.    Past Medical History:  Diagnosis Date  . Hydradenitis   . Hypokalemia   . Hypomagnesemia   . Obesity (BMI 30-39.9)   . Obesity hypoventilation syndrome (HCC)   . Tobacco use     Past Surgical History:  Procedure Laterality Date  . TONSILLECTOMY       reports that she has been smoking cigarettes. She has a 3.75 pack-year smoking history. She has never used smokeless tobacco. She reports that she does not drink alcohol or use drugs.  Allergies  Allergen Reactions  . Advil [Ibuprofen] Hives and Itching    Family History  Problem Relation Age of Onset  . Hypertension Mother   . Cancer Father     Prior to Admission medications   Medication Sig Start Date End Date Taking? Authorizing Provider  albuterol (PROVENTIL HFA;VENTOLIN HFA) 108 (90 Base) MCG/ACT inhaler Inhale 2 puffs into the lungs every 6 (six) hours as needed for wheezing or shortness of breath. 04/19/17   Leslye PeerByrum, Robert S, MD  Budesonide (PULMICORT FLEXHALER) 90 MCG/ACT inhaler Inhale 1 puff into the lungs 2 (two) times daily. 01/31/17   Noralee Stainhoi, Jennifer, DO  Cetirizine HCl (ZYRTEC ALLERGY) 10 MG CAPS Take 1 capsule (10 mg total) by mouth daily. 01/31/17   Noralee Stainhoi, Jennifer, DO  diphenhydrAMINE (BENADRYL) 25 MG tablet Take 25 mg by mouth every 6 (six) hours as needed.    [provider]  doxycycline (VIBRA-TABS) 100 MG tablet Take 1 tablet (100 mg total) by mouth 2 (two) times daily. 05/10/18   Anders SimmondsMcClung, Angela M, PA-C  famotidine (PEPCID) 20 MG tablet Take 1 tablet (20 mg total) by mouth 2 (  two) times daily. 01/31/17   Dessa Phi, DO  fluticasone (FLONASE) 50 MCG/ACT nasal spray Place 2 sprays into both nostrils daily. 01/31/17   Dessa Phi, DO  Multiple Vitamin (MULTIVITAMIN WITH MINERALS) TABS tablet Take 1 tablet by mouth daily.    [provider]  mupirocin ointment (BACTROBAN) 2 % Place 1 application into the nose 2 (two) times daily. For 5 days. 03/10/17   Ladell Pier,  MD  mupirocin ointment (BACTROBAN) 2 % Place 1 application into the nose 2 (two) times daily. 05/10/18   Argentina Donovan, PA-C  OVER THE COUNTER MEDICATION Take 1 tablet by mouth daily.    [provider]  vitamin B-12 (CYANOCOBALAMIN) 1000 MCG tablet Take 1,000 mcg by mouth daily.    [provider]    Physical Exam: Vitals:   06/20/19 1330 06/20/19 1345 06/20/19 1400 06/20/19 1415  BP: 118/82 115/84 (!) 124/94 107/83  Pulse: 76 81 80 82  Resp: 15 18 (!) 23 17  Temp:      TempSrc:      SpO2: 98% 95% 98% 97%    Constitutional: NAD, calm, comfortable Vitals:   06/20/19 1330 06/20/19 1345 06/20/19 1400 06/20/19 1415  BP: 118/82 115/84 (!) 124/94 107/83  Pulse: 76 81 80 82  Resp: 15 18 (!) 23 17  Temp:      TempSrc:      SpO2: 98% 95% 98% 97%   Constitutional: Alert and oriented x3, communicating well, not in acute distress.  Eyes: PERRL, lids and conjunctivae normal ENMT: Mucous membranes are moist. Posterior pharynx clear of any exudate or lesions.Normal dentition.  Neck: normal, supple, no masses, no thyromegaly Respiratory: clear to auscultation bilaterally, no wheezing, no crackles. Normal respiratory effort. No accessory muscle use.  Cardiovascular: Regular rate and rhythm, no murmurs / rubs / gallops. No extremity edema. 2+ pedal pulses. No carotid bruits.  Abdomen: no tenderness, no masses palpated. No hepatosplenomegaly. Bowel sounds positive.  Musculoskeletal: no clubbing / cyanosis. No joint deformity upper and lower extremities. Good ROM, no contractures. Normal muscle tone.  Skin: no rashes, lesions, ulcers. No induration Neurologic: Power 2 out of 5 in bilateral lower extremities, 5 out of 5 in bilateral upper extremities.  Reflexes: Knee reflexes: 2+ bilaterally, sensation intact. Psychiatric: Normal judgment and insight. Alert and oriented x 3. Normal mood.    Labs on Admission: I have personally reviewed following labs and imaging studies  CBC:  Recent Labs  Lab 06/20/19 1257  WBC 14.7*  HGB 14.1  HCT 44.5  MCV 89.9  PLT 595   Basic Metabolic Panel: Recent Labs  Lab 06/20/19 1257 06/20/19 1353  NA 137  --   K <2.0*  --   CL 101  --   CO2 22  --   GLUCOSE 164*  --   BUN 10  --   CREATININE 1.26*  --   CALCIUM 8.6*  --   MG  --  1.7   GFR: CrCl cannot be calculated (Unknown ideal weight.). Liver Function Tests: No results for input(s): AST, ALT, ALKPHOS, BILITOT, PROT, ALBUMIN in the last 168 hours. No results for input(s): LIPASE, AMYLASE in the last 168 hours. No results for input(s): AMMONIA in the last 168 hours. Coagulation Profile: No results for input(s): INR, PROTIME in the last 168 hours. Cardiac Enzymes: No results for input(s): CKTOTAL, CKMB, CKMBINDEX, TROPONINI in the last 168 hours. BNP (last 3 results) No results for input(s): PROBNP in the last 8760 hours. HbA1C:  No results for input(s): HGBA1C in the last 72 hours. CBG: Recent Labs  Lab 06/20/19 1345  GLUCAP 141*   Lipid Profile: No results for input(s): CHOL, HDL, LDLCALC, TRIG, CHOLHDL, LDLDIRECT in the last 72 hours. Thyroid Function Tests: Recent Labs    06/20/19 1353  TSH 2.093   Anemia Panel: No results for input(s): VITAMINB12, FOLATE, FERRITIN, TIBC, IRON, RETICCTPCT in the last 72 hours. Urine analysis:    Component Value Date/Time   COLORURINE STRAW (A) 06/20/2019 1353   APPEARANCEUR CLEAR 06/20/2019 1353   LABSPEC 1.006 06/20/2019 1353   PHURINE 6.0 06/20/2019 1353   GLUCOSEU NEGATIVE 06/20/2019 1353   HGBUR SMALL (A) 06/20/2019 1353   BILIRUBINUR NEGATIVE 06/20/2019 1353   KETONESUR NEGATIVE 06/20/2019 1353   PROTEINUR 30 (A) 06/20/2019 1353   UROBILINOGEN 1.0 10/08/2009 1824   NITRITE NEGATIVE 06/20/2019 1353   LEUKOCYTESUR NEGATIVE 06/20/2019 1353    Radiological Exams on Admission: No results found.  EKG: Sinus tachycardia with heart rate of 107/min, no ST-T wave changes noted.  Assessment/Plan Active  Problems:   Obstructive sleep apnea   Obesity (BMI 30-39.9)   Obesity hypoventilation syndrome (HCC)   Tobacco use   Hypokalemia   Hypomagnesemia   Lower extremity weakness   Acute kidney injury (HCC)   Hyperglycemia   Leukocytosis   Severe Hypokalemia: -Patient presented with bilateral lower weakness, severe hypokalemia with potassium level less than 2.  Denies previous history of severe hypokalemia or family history of similar symptoms.  Reviewed medications-patient does not take any medication such as diuretics/inhalers at home except Tylenol as needed. -Admit patient under observation, on telemetry -Replace potassium, continue IV fluids -Check A1c, TSH. -Consulted physical therapy, neurochecks -Discussed with th neurology Dr. Aror-Mentioned that patient symptoms are likely related to severe hypokalemia, he recommended to correct K+-if she continues to have symptoms-we will order imaging.  Neurology will evaluate the patient.  Hypomagnesemia: -Replace magnesium -Repeat magnesium level tomorrow a.m.  Chronic leukocytosis: -Unknown etiology.  Patient is afebrile. No UTI symptoms.  UA is negative. -No indication n of antibiotics at this time.  AKI: Likely secondary to dehydration -Continue IV fluids -Repeat BMP tomorrow a.m.  Hyperglycemia: Blood glucose was 164, repeat 141 -On CBG monitoring -Check A1c  Obstructive sleep apnea: -Noncompliant with CPAP due to insurance issues  Morbid obesity: -Needs dietary modification, exercise and weight loss counseling  Tobacco abuse: -Discussed about cessation and she verbalized understanding.  DVT prophylaxis: Lovenox Code Status: Full code Family Communication: Husband present at bedside.  Plan of care discussed with patient in length and he verbalized understanding and agreed with it. Disposition Plan: To be determined Consults called: None Admission status: Observation   Ollen Bowl MD [facility] Pager [phone]  If 7PM-7AM, please contact night-coverage [email or web address] Password [email or web address]  06/20/2019, 3:12 PM

## 2019-06-20 NOTE — ED Notes (Signed)
Attempted to give report and Ronalee Belts, RN stated he need to look up the pt to see if he's appropriate and he will call me back

## 2019-06-20 NOTE — ED Notes (Signed)
Pt is NSR on monitor 

## 2019-06-20 NOTE — ED Notes (Signed)
ED TO INPATIENT HANDOFF REPORT  ED Nurse Name and Phone #: Percival Spanishlana [phone]  S Name/Age/Gender Elizabeth PilotJodie L Hannon 38 y.o. female Room/Bed: 039C/039C  Code Status   Code Status: Full Code  Home/SNF/Other Home Patient oriented to: self, place, time and situation Is this baseline? Yes   Triage Complete: Triage complete  Chief Complaint weakness  Triage Note Pt presents to ED via PTAR with sudden onset of BUE and BLE weakness, unable to stand without assistance. Reports she went to the bathroom this am felt weak, went back again at 1100 am and was unable to stand. Grips/strength equal bilaterally to all extremities. covid test negative 2 days ago, mandatory testing for work. She is a CNA at [employer]    Allergies Allergies  Allergen Reactions  . Advil [Ibuprofen] Hives and Itching    Level of Care/Admitting Diagnosis ED Disposition    ED Disposition Condition Comment   Admit  [facility] [[id number]]  Level of Care: Telemetry Medical [104]  I expect the patient will be discharged within 24 hours: Yes  LOW acuity---Tx typically complete <24 hrs---ACUTE conditions typically can be evaluated <24 hours---LABS likely to return to acceptable levels <24 hours---IS near functional baseline---EXPECTED to return to current living arrangement---NOT newly hypoxic: Meets criteria for 5C-Observation unit  Covid Evaluation: Confirmed COVID Negative  Date Laboratory Confirmed COVID Negative: 06/18/2019  Diagnosis: Hypokalemic periodic paralysis [[id number]][[id number]]  Admitting Physician: Ollen BowlHWANI, RINKA R [[id number]]  Attending Physician: Ollen BowlPAHWANI, RINKA R [phone][[id number]]  PT Class (Do Not Modify): Observation [104]  PT Acc Code (Do Not Modify): Observation [10022]       B Medical/Surgery History Past Medical History:  Diagnosis Date  . Hydradenitis   . Hypokalemia   . Hypomagnesemia   . Obesity (BMI 30-39.9)   . Obesity hypoventilation syndrome (HCC)   . Tobacco use    Past  Surgical History:  Procedure Laterality Date  . TONSILLECTOMY       A IV Location/Drains/Wounds Patient Lines/Drains/Airways Status   Active Line/Drains/Airways    Name:   Placement date:   Placement time:   Site:   Days:   Peripheral IV 06/20/19 Left Hand   06/20/19    1400    Hand   less than 1   Peripheral IV 06/20/19 Left Antecubital   06/20/19    1530    Antecubital   less than 1          Intake/Output Last 24 hours  Intake/Output Summary (Last 24 hours) at 06/20/2019 2010 Last data filed at 06/20/2019 1721 Gross per 24 hour  Intake 250 ml  Output -  Net 250 ml    Labs/Imaging Results for orders placed or performed during the hospital encounter of 06/20/19 (from the past 48 hour(s))  Basic metabolic panel     Status: Abnormal   Collection Time: 06/20/19 12:57 PM  Result Value Ref Range   Sodium 137 135 - 145 mmol/L   Potassium <2.0 (LL) 3.5 - 5.1 mmol/L    Comment: RESULT REPEATED AND VERIFIED CRITICAL RESULT CALLED TO, READ BACK BY AND VERIFIED WITH: M.BARBER,RN 1409 06/20/2019 CLARK,S    Chloride 101 98 - 111 mmol/L   CO2 22 22 - 32 mmol/L   Glucose, Bld 164 (H) 70 - 99 mg/dL   BUN 10 6 - 20 mg/dL   Creatinine, Ser 0.861.26 (H) 0.44 - 1.00 mg/dL   Calcium 8.6 (L) 8.9 - 10.3 mg/dL   GFR calc non Af Amer 54 (L) >  60 mL/min   GFR calc Af Amer >60 >60 mL/min   Anion gap 14 5 - 15    Comment: Performed at [facility], [address], [place], [place] [address]  CBC     Status: Abnormal   Collection Time: 06/20/19 12:57 PM  Result Value Ref Range   WBC 14.7 (H) 4.0 - 10.5 K/uL   RBC 4.95 3.87 - 5.11 MIL/uL   Hemoglobin 14.1 12.0 - 15.0 g/dL   HCT 97.8 47.8 - 41.2 %   MCV 89.9 80.0 - 100.0 fL   MCH 28.5 26.0 - 34.0 pg   MCHC 31.7 30.0 - 36.0 g/dL   RDW 82.0 (H) 81.3 - 88.7 %   Platelets 336 150 - 400 K/uL   nRBC 0.0 0.0 - 0.2 %    Comment: Performed at [facility], [address] [place], [place] [address]  I-Stat beta hCG blood, ED     Status:  None   Collection Time: 06/20/19  1:18 PM  Result Value Ref Range   I-stat hCG, quantitative <5.0 <5 mIU/mL   Comment 3            Comment:   GEST. AGE      CONC.  (mIU/mL)   <=1 WEEK        5 - 50     2 WEEKS       50 - 500     3 WEEKS       100 - 10,000     4 WEEKS     1,000 - 30,000        FEMALE AND NON-PREGNANT FEMALE:     LESS THAN 5 mIU/mL   CBG monitoring, ED     Status: Abnormal   Collection Time: 06/20/19  1:45 PM  Result Value Ref Range   Glucose-Capillary 141 (H) 70 - 99 mg/dL  Urinalysis, Routine w reflex microscopic     Status: Abnormal   Collection Time: 06/20/19  1:53 PM  Result Value Ref Range   Color, Urine STRAW (A) YELLOW   APPearance CLEAR CLEAR   Specific Gravity, Urine 1.006 1.005 - 1.030   pH 6.0 5.0 - 8.0   Glucose, UA NEGATIVE NEGATIVE mg/dL   Hgb urine dipstick SMALL (A) NEGATIVE   Bilirubin Urine NEGATIVE NEGATIVE   Ketones, ur NEGATIVE NEGATIVE mg/dL   Protein, ur 30 (A) NEGATIVE mg/dL   Nitrite NEGATIVE NEGATIVE   Leukocytes,Ua NEGATIVE NEGATIVE   RBC / HPF 0-5 0 - 5 RBC/hpf   WBC, UA 0-5 0 - 5 WBC/hpf   Bacteria, UA NONE SEEN NONE SEEN   Squamous Epithelial / LPF 0-5 0 - 5    Comment: Performed at [facility], [address] [place], [place] [address]  Magnesium     Status: None   Collection Time: 06/20/19  1:53 PM  Result Value Ref Range   Magnesium 1.7 1.7 - 2.4 mg/dL    Comment: Performed at [facility], [address], [place], [place] [address]  TSH     Status: None   Collection Time: 06/20/19  1:53 PM  Result Value Ref Range   TSH 2.093 0.350 - 4.500 uIU/mL    Comment: Performed by a 3rd Generation assay with a functional sensitivity of <=0.01 uIU/mL. Performed at [facility], [address] [place], [place] [address]   Troponin I (High Sensitivity)     Status: None   Collection Time:  06/20/19  1:53 PM  Result Value Ref Range   Troponin I (High Sensitivity) 15 <18 ng/L    Comment: (NOTE) Elevated high  sensitivity troponin I (hsTnI) values and significant  changes across serial measurements may suggest ACS but many other  chronic and acute conditions are known to elevate hsTnI results.  Refer to the "Links" section for chest pain algorithms and additional  guidance. Performed at [facility], [place] [address] [place], [place] [address]   Phosphorus     Status: Abnormal   Collection Time: 06/20/19  3:25 PM  Result Value Ref Range   Phosphorus 1.7 (L) 2.5 - 4.6 mg/dL    Comment: Performed at [facility] [address] [place], [place] [address]  TSH     Status: None   Collection Time: 06/20/19  3:25 PM  Result Value Ref Range   TSH 1.206 0.350 - 4.500 uIU/mL    Comment: Performed by a 3rd Generation assay with a functional sensitivity of <=0.01 uIU/mL. Performed at [facility], [place] [address] [place],  [address]   SARS Coronavirus 2 [facility] order, Performed in [facility] hospital lab) Nasopharyngeal Nasopharyngeal Swab     Status: None   Collection Time: 06/20/19  3:36 PM   Specimen: Nasopharyngeal Swab  Result Value Ref Range   SARS Coronavirus 2 NEGATIVE NEGATIVE    Comment: (NOTE) If result is NEGATIVE SARS-CoV-2 target nucleic acids are NOT DETECTED. The SARS-CoV-2 RNA is generally detectable in upper and lower  respiratory specimens during the acute phase of infection. The lowest  concentration of SARS-CoV-2 viral copies this assay can detect is 250  copies / mL. A negative result does not preclude SARS-CoV-2 infection  and should not be used as the sole basis for treatment or other  patient management decisions.  A negative result may occur with  improper specimen collection / handling, submission of specimen other  than nasopharyngeal swab, presence of viral mutation(s) within the  areas targeted by this assay, and inadequate number of viral copies  (<250 copies / mL). A negative result must be combined with clinical  observations, patient  history, and epidemiological information. If result is POSITIVE SARS-CoV-2 target nucleic acids are DETECTED. The SARS-CoV-2 RNA is generally detectable in upper and lower  respiratory specimens dur ing the acute phase of infection.  Positive  results are indicative of active infection with SARS-CoV-2.  Clinical  correlation with patient history and other diagnostic information is  necessary to determine patient infection status.  Positive results do  not rule out bacterial infection or co-infection with other viruses. If result is PRESUMPTIVE POSTIVE SARS-CoV-2 nucleic acids MAY BE PRESENT.   A presumptive positive result was obtained on the submitted specimen  and confirmed on repeat testing.  While 2019 novel coronavirus  (SARS-CoV-2) nucleic acids may be present in the submitted sample  additional confirmatory testing may be necessary for epidemiological  and / or clinical management purposes  to differentiate between  SARS-CoV-2 and other Sarbecovirus currently known to infect humans.  If clinically indicated additional testing with an alternate test  methodology [phone]) is advised. The SARS-CoV-2 RNA is generally  detectable in upper and lower respiratory sp ecimens during the acute  phase of infection. The expected result is Negative. Fact Sheet for Patients:  StrictlyIdeas.no Fact Sheet for Healthcare Providers: BankingDealers.co.za This test is not yet approved or cleared by the [place] FDA and has been authorized for detection and/or diagnosis of  SARS-CoV-2 by FDA under an Emergency Use Authorization (EUA).  This EUA will remain in effect (meaning this test can be used) for the duration of the COVID-19 declaration under Section 564(b)(1) of the Act, 21 U.S.C. section 360bbb-3(b)(1), unless the authorization is terminated or revoked sooner. Performed at [facility], [address] [place], [place] [address]     No results found.  Pending Labs Unresulted Labs (From admission, onward)    Start     Ordered   06/27/19 0500  Creatinine, serum  (enoxaparin (LOVENOX)    CrCl >/= 30 ml/min)  Weekly,   R    Comments: while on enoxaparin therapy    06/20/19 1506   06/21/19 0500  Comprehensive metabolic panel  Tomorrow morning,   R     06/20/19 1506   06/21/19 0500  CBC  Tomorrow morning,   R     06/20/19 1506   06/21/19 0500  Magnesium  Tomorrow morning,   R     06/20/19 1507   06/20/19 1505  Hemoglobin A1c  Once,   STAT     06/20/19 1506   06/20/19 1502  HIV antibody (Routine Testing)  Once,   STAT     06/20/19 1506          Vitals/Pain Today's Vitals   06/20/19 1415 06/20/19 1722 06/20/19 1809 06/20/19 1900  BP: 107/83 109/68 125/66 122/68  Pulse: 82 75 77 85  Resp: 17 15  20   Temp:      TempSrc:      SpO2: 97% 97% 97% 97%  PainSc:   5      Isolation Precautions No active isolations  Medications Medications  enoxaparin (LOVENOX) injection 40 mg (40 mg Subcutaneous Given 06/20/19 1725)  acetaminophen (TYLENOL) tablet 650 mg (650 mg Oral Given 06/20/19 1958)    Or  acetaminophen (TYLENOL) suppository 650 mg ( Rectal See Alternative 06/20/19 1958)  ondansetron (ZOFRAN) tablet 4 mg (has no administration in time range)    Or  ondansetron (ZOFRAN) injection 4 mg (has no administration in time range)  0.9 % NaCl with KCl 40 mEq / L  infusion (100 mL/hr Intravenous New Bag/Given 06/20/19 1723)  sodium chloride flush (NS) 0.9 % injection 3 mL (3 mLs Intravenous Given 06/20/19 1400)  potassium chloride SA (K-DUR) CR tablet 80 mEq (80 mEq Oral Given 06/20/19 1431)  potassium chloride 10 mEq in 100 mL IVPB (0 mEq Intravenous Stopped 06/20/19 1721)  potassium chloride 10 mEq in 100 mL IVPB (0 mEq Intravenous Stopped 06/20/19 1545)  magnesium sulfate IVPB 2 g 50 mL (0 g Intravenous Stopped 06/20/19 1721)    Mobility walks     Focused Assessments Cardiac Assessment Handoff:    Lab  Results  Component Value Date   TROPONINI <0.03 01/29/2017   No results found for: DDIMER Does the Patient currently have chest pain? No     R Recommendations: See Admitting Provider Note  Report given to:   Additional Notes:

## 2019-06-20 NOTE — ED Triage Notes (Signed)
Pt presents to ED via PTAR with sudden onset of BUE and BLE weakness, unable to stand without assistance. Reports she went to the bathroom this am felt weak, went back again at 1100 am and was unable to stand. Grips/strength equal bilaterally to all extremities. covid test negative 2 days ago, mandatory testing for work. She is a CNA at [employer]

## 2019-06-21 ENCOUNTER — Encounter (HOSPITAL_COMMUNITY): Payer: Self-pay | Admitting: Surgery

## 2019-06-21 ENCOUNTER — Observation Stay (HOSPITAL_COMMUNITY): Payer: Self-pay

## 2019-06-21 DIAGNOSIS — E662 Morbid (severe) obesity with alveolar hypoventilation: Secondary | ICD-10-CM

## 2019-06-21 DIAGNOSIS — R29898 Other symptoms and signs involving the musculoskeletal system: Secondary | ICD-10-CM

## 2019-06-21 LAB — COMPREHENSIVE METABOLIC PANEL
ALT: 19 U/L (ref 0–44)
AST: 23 U/L (ref 15–41)
Albumin: 2.5 g/dL — ABNORMAL LOW (ref 3.5–5.0)
Alkaline Phosphatase: 104 U/L (ref 38–126)
Anion gap: 11 (ref 5–15)
BUN: 9 mg/dL (ref 6–20)
CO2: 25 mmol/L (ref 22–32)
Calcium: 7.7 mg/dL — ABNORMAL LOW (ref 8.9–10.3)
Chloride: 103 mmol/L (ref 98–111)
Creatinine, Ser: 1.27 mg/dL — ABNORMAL HIGH (ref 0.44–1.00)
GFR calc Af Amer: >60 mL/min (ref >60)
GFR calc non Af Amer: 54 mL/min — ABNORMAL LOW (ref >60)
Glucose, Bld: 150 mg/dL — ABNORMAL HIGH (ref 70–99)
Potassium: <2 mmol/L — CL (ref 3.5–5.1)
Sodium: 139 mmol/L (ref 135–145)
Total Bilirubin: 0.3 mg/dL (ref 0.3–1.2)
Total Protein: 6.8 g/dL (ref 6.5–8.1)

## 2019-06-21 LAB — CBC
HCT: 40.8 % (ref 36.0–46.0)
Hemoglobin: 13.3 g/dL (ref 12.0–15.0)
MCH: 29 pg (ref 26.0–34.0)
MCHC: 32.6 g/dL (ref 30.0–36.0)
MCV: 88.9 fL (ref 80.0–100.0)
Platelets: 301 10*3/uL (ref 150–400)
RBC: 4.59 MIL/uL (ref 3.87–5.11)
RDW: 16.7 % — ABNORMAL HIGH (ref 11.5–15.5)
WBC: 15.5 10*3/uL — ABNORMAL HIGH (ref 4.0–10.5)
nRBC: 0 % (ref 0.0–0.2)

## 2019-06-21 LAB — BASIC METABOLIC PANEL
Anion gap: 11 (ref 5–15)
BUN: 9 mg/dL (ref 6–20)
CO2: 26 mmol/L (ref 22–32)
Calcium: 7.8 mg/dL — ABNORMAL LOW (ref 8.9–10.3)
Chloride: 103 mmol/L (ref 98–111)
Creatinine, Ser: 1.12 mg/dL — ABNORMAL HIGH (ref 0.44–1.00)
GFR calc Af Amer: >60 mL/min (ref >60)
GFR calc non Af Amer: >60 mL/min (ref >60)
Glucose, Bld: 109 mg/dL — ABNORMAL HIGH (ref 70–99)
Potassium: <2 mmol/L — CL (ref 3.5–5.1)
Sodium: 140 mmol/L (ref 135–145)

## 2019-06-21 LAB — HEMOGLOBIN A1C
Hgb A1c MFr Bld: 6 % — ABNORMAL HIGH (ref 4.8–5.6)
Mean Plasma Glucose: 126 mg/dL

## 2019-06-21 LAB — GLUCOSE, CAPILLARY: Glucose-Capillary: 120 mg/dL — ABNORMAL HIGH (ref 70–99)

## 2019-06-21 LAB — PROCALCITONIN: Procalcitonin: 0.35 ng/mL

## 2019-06-21 LAB — HIV ANTIBODY (ROUTINE TESTING W REFLEX): HIV Screen 4th Generation wRfx: NONREACTIVE

## 2019-06-21 LAB — MAGNESIUM: Magnesium: 1.9 mg/dL (ref 1.7–2.4)

## 2019-06-21 MED ORDER — POTASSIUM CHLORIDE CRYS ER 20 MEQ PO TBCR
40.0000 meq | EXTENDED_RELEASE_TABLET | Freq: Two times a day (BID) | ORAL | Status: AC
  Administered 2019-06-21 (×2): 40 meq via ORAL
  Filled 2019-06-21 (×2): qty 2

## 2019-06-21 MED ORDER — POTASSIUM CHLORIDE 10 MEQ/100ML IV SOLN
10.0000 meq | INTRAVENOUS | Status: AC
  Filled 2019-06-21 (×2): qty 100

## 2019-06-21 MED ORDER — ADULT MULTIVITAMIN W/MINERALS CH
1.0000 | ORAL_TABLET | Freq: Every day | ORAL | Status: DC
  Administered 2019-06-21 – 2019-06-23 (×3): 1 via ORAL
  Filled 2019-06-21 (×3): qty 1

## 2019-06-21 MED ORDER — POTASSIUM CHLORIDE 10 MEQ/100ML IV SOLN
10.0000 meq | INTRAVENOUS | Status: AC
  Administered 2019-06-21 – 2019-06-22 (×3): 10 meq via INTRAVENOUS
  Filled 2019-06-21: qty 100

## 2019-06-21 MED ORDER — POTASSIUM CHLORIDE 10 MEQ/100ML IV SOLN
INTRAVENOUS | Status: AC
  Administered 2019-06-21: 10 meq
  Filled 2019-06-21: qty 100

## 2019-06-21 MED ORDER — ENSURE MAX PROTEIN PO LIQD
11.0000 [oz_av] | Freq: Every day | ORAL | Status: DC
  Administered 2019-06-22: 11 [oz_av] via ORAL
  Filled 2019-06-21 (×3): qty 330

## 2019-06-21 NOTE — Progress Notes (Signed)
Rehab Admissions Coordinator Note:  Patient was screened by Jhonnie Garner for appropriateness for an Inpatient Acute Rehab Consult.  At this time it appears the pt's profound weakness is related to her severe hypokalemia. Anticipate that once that is corrected the pt will functionally improve. Given this working diagnosis, this patient does not have a diagnosis amenable to IP Rehab. Please re-send screen if this changes.   Jhonnie Garner 06/21/2019, 4:48 PM  I can be reached at [phone].

## 2019-06-21 NOTE — Progress Notes (Signed)
Mew score of 3 due to short episode of SVT (increased pulse rate), now normal sinus rhythm with pulse at 80 HR, will continue to monitor Neta Mends RN 10:27 AM 06-21-2019

## 2019-06-21 NOTE — Progress Notes (Signed)
Paged MD Horris Latino regarding patient's critical potassium level of less than 2. Awaiting callback or orders. Neta Mends RN 10:59 AM

## 2019-06-21 NOTE — Progress Notes (Signed)
Initial Nutrition Assessment  RD working remotely.  DOCUMENTATION CODES:   Morbid obesity  INTERVENTION:   -MVI with minerals daily -Ensure Max po daily, each supplement provides 150 kcal and 30 grams of protein  NUTRITION DIAGNOSIS:   Increased nutrient needs related to chronic illness as evidenced by estimated needs.  GOAL:   Patient will meet greater than or equal to 90% of their needs  MONITOR:   PO intake, Supplement acceptance, Labs, Weight trends, Skin, I & O's  REASON FOR ASSESSMENT:   Malnutrition Screening Tool    ASSESSMENT:   Elizabeth Campbell is a 38 y.o. female with medical history significant of obesity, tobacco abuse, sleep apnea (noncompliant with CPAP machine) obesity hypoventilation syndrome presents to emergency department due to weakness in bilateral lower extremities and achiness started this morning.  Reports that at 11:30 AM she went to bathroom, was unable to move her legs so she called her mom who called 911 and brought patient to the emergency department for further evaluation and management.  Pt admitted with severe hypokalemia and bilateral lower extremity weakness.   Reviewed I/O's: -1.4 L x 24 hours  UOP: 1.8 L x 24 hours  Attempted to speak with pt via phone, however, no answer.   Pt is currently on a carb modified diet. No meal completion data currently available to assess.   Pt has experienced a 9% wt loss over the past past year. This is not significant for time frame. Slow, moderate weight loss is favorable, given pt's morbid obesity.   Medications reviewed and include 0.9% NaCl with KCl 40 mEq/L infusion @ 100 ml/hr.   Labs reviewed: K: 2.0, CBGS: 120.  Diet Order:   Diet Order            Diet Carb Modified Fluid consistency: Thin; Room service appropriate? Yes  Diet effective now              EDUCATION NEEDS:   No education needs have been identified at this time  Skin:  Skin Assessment: Reviewed RN  Assessment  Last BM:  Unknown  Height:   Ht Readings from Last 1 Encounters:  06/20/19 5\' 10"  (1.778 m)    Weight:   Wt Readings from Last 1 Encounters:  06/21/19 132.6 kg    Ideal Body Weight:  72.7 kg  BMI:  Body mass index is 41.94 kg/m.  Estimated Nutritional Needs:   Kcal:  1800-2000  Protein:  110-125 grams  Fluid:  1.8-2.0 L    Elizabeth Campbell A. Jimmye Norman, RD, LDN, [facility] Registered Dietitian II Certified Diabetes Care and Education Specialist Pager: [phone] After hours Pager: [phone]

## 2019-06-21 NOTE — Progress Notes (Signed)
PROGRESS NOTE  Elizabeth Campbell GEX:[id number] DOB: 1981/05/01 DOA: 06/20/2019 PCP: Ladell Pier, MD  HPI/Recap of past 24 hours: HPI from Dr Doristine Bosworth Mikel Cella is a 38 y.o. female with medical history significant of obesity, tobacco abuse, sleep apnea (noncompliant with CPAP machine) obesity hypoventilation syndrome presents to emergency department due to weakness in bilateral lower extremities and inability to move them X 1 day. Pt drinks 4 to 5 cans of Dr. Malachi Bonds per day, smokes half pack of cigarettes per day, denies alcohol, illicit drug use.  She lives with her mom at home and independent on daily life activities.  She is CNA at [employer]. In the ED, patient was found to have severe hypokalemia with potassium level less than 2.  Patient admitted for further management.    Today, patient reports that she is able to move her legs, but cannot bear weight on them, still unable to walk.  Denies any other complaints, denies any chest pain, shortness of breath, abdominal pain, nausea/vomiting, diarrhea, fever/chills, denies any urinary or bowel incontinence.  Assessment/Plan: Active Problems:   Obstructive sleep apnea   Obesity (BMI 30-39.9)   Obesity hypoventilation syndrome (HCC)   Tobacco use   Hypokalemia   Hypomagnesemia   Lower extremity weakness   Acute kidney injury (HCC)   Hyperglycemia   Leukocytosis   Hypokalemic periodic paralysis  Bilateral lower extremity weakness Likely 2/2 ??severe hypokalemia Potassium levels noted to be less than 2 on admission Continue aggressive potassium replacement Neurology consulted, recommend aggressive potassium replacement, if adequately replaced and patient still with lower extremity weakness, may consider imaging  Severe hypokalemia Aggressive repletion of potassium If still significantly low, may involve nephrology Frequent BMP  AKI Continue IV fluids Daily BMP  Leukocytosis Chronic elevation of WBC  Currently afebrile No overt signs of infection: UA negative, chest x-ray unremarkable Will need outpatient hematology follow-up Daily CBC  Prediabetes Hemoglobin A1c 6 Lifestyle modification Follow-up with PCP  OSA/obesity hypoventilation syndrome/history of pulmonary hypertension Advised to be compliant with CPAP Supplemental oxygen as needed  Morbid obesity Lifestyle modification advised  Tobacco abuse Advised to quit       Malnutrition Type:  Nutrition Problem: Increased nutrient needs Etiology: chronic illness   Malnutrition Characteristics:  Signs/Symptoms: estimated needs   Nutrition Interventions:  Interventions: MVI, Premier Protein    Estimated body mass index is 41.94 kg/m as calculated from the following:   Height as of this encounter: 5\' 10"  (1.778 m).   Weight as of this encounter: 132.6 kg.     Code Status: Full  Family Communication: None at bedside  Disposition Plan: CIR   Consultants:  Neurology  Procedures:  None  Antimicrobials:  None  DVT prophylaxis: Lovenox   Objective: Vitals:   06/20/19 2344 06/21/19 0350 06/21/19 0351 06/21/19 1221  BP: 121/62 108/62  112/65  Pulse: 75 86  75  Resp: 20 20  18   Temp: 98.4 F (36.9 C) 97.8 F (36.6 C)  98.5 F (36.9 C)  TempSrc: Oral Oral  Oral  SpO2: 98% (!) 89% 92% 90%  Weight:   132.6 kg   Height:        Intake/Output Summary (Last 24 hours) at 06/21/2019 1630 Last data filed at 06/21/2019 1100 Gross per 24 hour  Intake 1986.5 ml  Output 2900 ml  Net -913.5 ml   Filed Weights   06/20/19 2158 06/21/19 0351  Weight: 134.5 kg 132.6 kg    Exam:  General: NAD  Cardiovascular: S1, S2 present  Respiratory:  Diminished breath sounds bilaterally  Abdomen: Soft, nontender, nondistended, bowel sounds present  Musculoskeletal: No bilateral pedal edema noted  Skin: Normal  Psychiatry: Normal mood  Neurology: Strength 3/5 in bilateral lower extremities, 5/5 in  bilateral upper extremities   Data Reviewed: CBC: Recent Labs  Lab 06/20/19 1257 06/21/19 0951  WBC 14.7* 15.5*  HGB 14.1 13.3  HCT 44.5 40.8  MCV 89.9 88.9  PLT 336 301   Basic Metabolic Panel: Recent Labs  Lab 06/20/19 1257 06/20/19 1353 06/20/19 1525 06/21/19 0951  NA 137  --   --  139  K <2.0*  --   --  <2.0*  CL 101  --   --  103  CO2 22  --   --  25  GLUCOSE 164*  --   --  150*  BUN 10  --   --  9  CREATININE 1.26*  --   --  1.27*  CALCIUM 8.6*  --   --  7.7*  MG  --  1.7  --  1.9  PHOS  --   --  1.7*  --    GFR: Estimated Creatinine Clearance: 90.1 mL/min (A) (by C-G formula based on SCr of 1.27 mg/dL (H)). Liver Function Tests: Recent Labs  Lab 06/21/19 0951  AST 23  ALT 19  ALKPHOS 104  BILITOT 0.3  PROT 6.8  ALBUMIN 2.5*   No results for input(s): LIPASE, AMYLASE in the last 168 hours. No results for input(s): AMMONIA in the last 168 hours. Coagulation Profile: No results for input(s): INR, PROTIME in the last 168 hours. Cardiac Enzymes: No results for input(s): CKTOTAL, CKMB, CKMBINDEX, TROPONINI in the last 168 hours. BNP (last 3 results) No results for input(s): PROBNP in the last 8760 hours. HbA1C: Recent Labs    06/20/19 1525  HGBA1C 6.0*   CBG: Recent Labs  Lab 06/20/19 1345 06/21/19 0612  GLUCAP 141* 120*   Lipid Profile: No results for input(s): CHOL, HDL, LDLCALC, TRIG, CHOLHDL, LDLDIRECT in the last 72 hours. Thyroid Function Tests: Recent Labs    06/20/19 1525  TSH 1.206   Anemia Panel: No results for input(s): VITAMINB12, FOLATE, FERRITIN, TIBC, IRON, RETICCTPCT in the last 72 hours. Urine analysis:    Component Value Date/Time   COLORURINE STRAW (A) 06/20/2019 1353   APPEARANCEUR CLEAR 06/20/2019 1353   LABSPEC 1.006 06/20/2019 1353   PHURINE 6.0 06/20/2019 1353   GLUCOSEU NEGATIVE 06/20/2019 1353   HGBUR SMALL (A) 06/20/2019 1353   BILIRUBINUR NEGATIVE 06/20/2019 1353   KETONESUR NEGATIVE 06/20/2019 1353    PROTEINUR 30 (A) 06/20/2019 1353   UROBILINOGEN 1.0 10/08/2009 1824   NITRITE NEGATIVE 06/20/2019 1353   LEUKOCYTESUR NEGATIVE 06/20/2019 1353   Sepsis Labs: @LABRCNTIP (procalcitonin:4,lacticidven:4)  ) Recent Results (from the past 240 hour(s))  SARS Coronavirus 2 [facility](Hospital order, [facility]) Nasopharyngeal Nasopharyngeal Swab     Status: None   Collection Time: 06/20/19  3:36 PM   Specimen: Nasopharyngeal Swab  Result Value Ref Range Status   SARS Coronavirus 2 NEGATIVE NEGATIVE Final    Comment: (NOTE) If result is NEGATIVE SARS-CoV-2 target nucleic acids are NOT DETECTED. The SARS-CoV-2 RNA is generally detectable in upper and lower  respiratory specimens during the acute phase of infection. The lowest  concentration of SARS-CoV-2 viral copies this assay can detect is 250  copies / mL. A negative result does not preclude SARS-CoV-2 infection  and should not be used  as the sole basis for treatment or other  patient management decisions.  A negative result may occur with  improper specimen collection / handling, submission of specimen other  than nasopharyngeal swab, presence of viral mutation(s) within the  areas targeted by this assay, and inadequate number of viral copies  (<250 copies / mL). A negative result must be combined with clinical  observations, patient history, and epidemiological information. If result is POSITIVE SARS-CoV-2 target nucleic acids are DETECTED. The SARS-CoV-2 RNA is generally detectable in upper and lower  respiratory specimens dur ing the acute phase of infection.  Positive  results are indicative of active infection with SARS-CoV-2.  Clinical  correlation with patient history and other diagnostic information is  necessary to determine patient infection status.  Positive results do  not rule out bacterial infection or co-infection with other viruses. If result is PRESUMPTIVE POSTIVE SARS-CoV-2 nucleic acids MAY BE  PRESENT.   A presumptive positive result was obtained on the submitted specimen  and confirmed on repeat testing.  While 2019 novel coronavirus  (SARS-CoV-2) nucleic acids may be present in the submitted sample  additional confirmatory testing may be necessary for epidemiological  and / or clinical management purposes  to differentiate between  SARS-CoV-2 and other Sarbecovirus currently known to infect humans.  If clinically indicated additional testing with an alternate test  methodology [phone]) is advised. The SARS-CoV-2 RNA is generally  detectable in upper and lower respiratory sp ecimens during the acute  phase of infection. The expected result is Negative. Fact Sheet for Patients:  BoilerBrush.com.cy Fact Sheet for Healthcare Providers: https://pope.com/ This test is not yet approved or cleared by the [place] FDA and has been authorized for detection and/or diagnosis of SARS-CoV-2 by FDA under an Emergency Use Authorization (EUA).  This EUA will remain in effect (meaning this test can be used) for the duration of the COVID-19 declaration under Section 564(b)(1) of the Act, 21 U.S.C. section 360bbb-3(b)(1), unless the authorization is terminated or revoked sooner. Performed at [facility], [address] [place], [place] [address]       Studies: Dg Chest Port 1 View  Result Date: 06/21/2019 CLINICAL DATA:  Leukocytosis EXAM: PORTABLE CHEST 1 VIEW COMPARISON:  01/28/2017 FINDINGS: Cardiomegaly. Both lungs are clear. Chronic fracture deformity of the left clavicle. IMPRESSION: Cardiomegaly without acute abnormality of the lungs in AP portable projection. Electronically Signed   By: Lauralyn Primes M.D.   On: 06/21/2019 12:09    Scheduled Meds: . enoxaparin (LOVENOX) injection  40 mg Subcutaneous Q24H  . multivitamin with minerals  1 tablet Oral Daily  . potassium chloride  40 mEq Oral BID  . Ensure Max Protein  11  oz Oral Daily    Continuous Infusions: . 0.9 % NaCl with KCl 40 mEq / L 100 mL/hr (06/21/19 0352)     LOS: 0 days     Briant Cedar, MD [facility]  If 7PM-7AM, please contact night-coverage [email or web address] 06/21/2019, 4:30 PM

## 2019-06-21 NOTE — Evaluation (Signed)
Physical Therapy Evaluation Patient Details Name: Elizabeth Campbell MRN: [id number] DOB: [birth year] Today's Date: 06/21/2019   History of Present Illness  [age] yo admitted with sudden onset bil UE and bil LE weakness with profound hypokalemia. No spinal imaging. PMhx: obesity, sleep apnea, hidradenitis  Clinical Impression  Pt pleasant and eager to move reporting complete independence, working and moving without difficulty until 11am 9/16. Pt currently with bil UE and LE weakness with impaired sensation and low back pain in sitting resolved with supine positioning. Pt with grossly 2/5 hamstring strength and 2-/5 quad strength with increased weakness LLE vs RLE. Pt with decreased grip bil UE strength, impaired balance with posterior LOB sitting EOB. Pt with significant decline in strength, sensation, balance, transfers and mobility who will benefit from acute therapy to maximize mobility, safety and function and currently will benefit from OT and CIR in current state.     Follow Up Recommendations CIR;Supervision/Assistance - 24 hour    Equipment Recommendations  Wheelchair cushion (measurements PT);Wheelchair (measurements PT);Hospital bed    Recommendations for Other Services OT consult;Rehab consult     Precautions / Restrictions Precautions Precautions: Fall Precaution Comments: bil LE and UE weakness      Mobility  Bed Mobility Overal bed mobility: Needs Assistance Bed Mobility: Rolling;Sidelying to Sit;Sit to Sidelying;Sit to Supine Rolling: Mod assist Sidelying to sit: Mod assist   Sit to supine: Mod assist Sit to sidelying: Mod assist General bed mobility comments: mod assist to bend knee and cross arm over chest to reach for rail with assist to rotate pelvis and roll to EOB to right x 2 and left x 1. Side to sit with assist to control legs off of bed and elevate trunk. REturn to sidelying assist to elevate legs to surface  Transfers                 General transfer  comment: attempted to stand EOB with bed height elevated and Stedy x 3 attempts with mod assist but unable to engage quads fully to rise from surface with return to lower surface  Ambulation/Gait                Stairs            Wheelchair Mobility    Modified Rankin (Stroke Patients Only)       Balance Overall balance assessment: Needs assistance   Sitting balance-Leahy Scale: Fair Sitting balance - Comments: pt with good sitting balance for initial sitting with pt controlling trunk and assisting using bil UE to position bil LE. After standing trial and return to lower surface pt with posterior LOB lying across bed with max assist to elevate trunk back to sitting                                     Pertinent Vitals/Pain Pain Assessment: 0-10 Pain Score: 4  Pain Location: low back with transition to sitting, bil LE tingling with transition to EOB Pain Descriptors / Indicators: Aching;Tingling;Pressure;Sore Pain Intervention(s): Limited activity within patient's tolerance;Monitored during session;Repositioned    Home Living Family/patient expects to be discharged to:: Private residence Living Arrangements: Parent;Children Available Help at Discharge: Family;Available PRN/intermittently Type of Home: House Home Access: Stairs to enter   Entrance Stairs-Number of Steps: 1 Home Layout: One level Home Equipment: None      Prior Function Level of Independence: Independent  Comments: works as a Quarry manager, 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 5/5 hip  Abduct/ADD, 4/5 dorsiflexion, knee flexion 2/5 LLE Deficits / Details: supine pt reports intact sensation, sitting EOB bil LE tingling. 5/5 hip Abduct/ADD, 1/5 dorsiflexion, knee flexion 2-/5    Cervical / Trunk Assessment Cervical / Trunk Assessment: Other exceptions Cervical / Trunk Exceptions: LOB with EOB, reports sudden loss of trunk control  Communication   Communication: No difficulties  Cognition Arousal/Alertness: Awake/alert Behavior During Therapy: WFL for tasks assessed/performed Overall Cognitive Status: Within Functional Limits for tasks assessed                                        General Comments      Exercises General Exercises - Lower Extremity Heel Slides: AAROM;Both;Supine;5 reps   Assessment/Plan    PT Assessment Patient needs continued PT services  PT Problem List Decreased strength;Decreased mobility;Decreased safety awareness;Decreased range of motion;Decreased coordination;Obesity;Decreased activity tolerance;Decreased balance;Decreased knowledge of use of DME;Impaired sensation       PT Treatment Interventions DME instruction;Therapeutic activities;Gait training;Therapeutic exercise;Patient/family education;Stair training;Functional mobility training;Balance training;Neuromuscular re-education;Wheelchair mobility training    PT Goals (Current goals can be found in the Care Plan section)  Acute Rehab PT Goals Patient Stated Goal: return to independence and work PT Goal Formulation: With patient Time For Goal Achievement: 07/05/19 Potential to Achieve Goals: Fair    Frequency Min 5X/week   Barriers to discharge Decreased caregiver support      Co-evaluation               AM-PAC PT "6 Clicks" Mobility  Outcome Measure Help needed turning from your back to your side while in a flat bed without using bedrails?: A Lot Help needed moving from lying on your back to sitting on the side of a flat bed without using bedrails?: A  Lot Help needed moving to and from a bed to a chair (including a wheelchair)?: Total Help needed standing up from a chair using your arms (e.g., wheelchair or bedside chair)?: Total Help needed to walk in hospital room?: Total Help needed climbing 3-5 steps with a railing? : Total 6 Click Score: 8    End of Session Equipment Utilized During Treatment: Gait belt Activity Tolerance: Patient tolerated treatment well Patient left: in bed;with call bell/phone within reach;with nursing/sitter in room Nurse Communication: Mobility status;Need for lift equipment PT Visit Diagnosis: Unsteadiness on feet (R26.81);Muscle weakness (generalized) (M62.81);Other symptoms and signs involving the nervous system (R29.898);Other abnormalities of gait and mobility (R26.89)    Time: 3790-2409 PT Time Calculation (min) (ACUTE ONLY): 43 min   Charges:   PT Evaluation $PT Eval Moderate Complexity: 1 Mod PT Treatments $Therapeutic Activity: 23-37 mins        [place], PT Acute Rehabilitation Services Pager: [phone] Office: [phone]   Tobias Avitabile B Azharia Surratt 06/21/2019, 8:50 AM

## 2019-06-21 NOTE — Consult Note (Addendum)
Neurology Consultation  Reason for Consult: lower extremity weakness bilaterally Referring Physician: Dr. Jacqulyn BathPahwani  CC: bilateral lower extremity weakness  History is obtained from:patient and medical record  HPI: Lodema PilotJodie L Trindade is a 38 y.o. female with a past medical history significant for obesity, sleep apnea (noncompliant), current smoker who [facility] for bilateral lower extremity weakness that started yesterday am (9/16) at around 1130 when she was attempting to get up from bed to walk to the bathroom. She states she needed to call her mom for help who then called EMS for her.  She denies back pain, radiculopathy or myelopathy. Neurology consulted for evaluation of lower extremity weakness.    In ED K was noted to be <2 and Phos 1.7, AKI with Cr 1.26.  ON BMP this am K is still <2.0   ROS: A 14 point ROS was performed and is negative except as noted in the HPI.  Unable to obtain due to altered mental status.   Past Medical History:  Diagnosis Date  . Hydradenitis   . Hypokalemia   . Hypomagnesemia   . Obesity (BMI 30-39.9)   . Obesity hypoventilation syndrome (HCC)   . Tobacco use      Family History  Problem Relation Age of Onset  . Hypertension Mother   . Cancer Father     Social History:   reports that she has been smoking cigarettes. She has a 3.75 pack-year smoking history. She has never used smokeless tobacco. She reports that she does not drink alcohol or use drugs.  Medications  Current Facility-Administered Medications:  .  0.9 % NaCl with KCl 40 mEq / L  infusion, , Intravenous, Continuous, Pahwani, Rinka R, MD, Last Rate: 100 mL/hr at 06/21/19 0352, 100 mL/hr at 06/21/19 0352 .  acetaminophen (TYLENOL) tablet 650 mg, 650 mg, Oral, Q6H PRN, 650 mg at 06/21/19 0231 **OR** acetaminophen (TYLENOL) suppository 650 mg, 650 mg, Rectal, Q6H PRN, Pahwani, Rinka R, MD .  enoxaparin (LOVENOX) injection 40 mg, 40 mg, Subcutaneous, Q24H, Pahwani, Rinka R, MD, 40  mg at 06/20/19 1725 .  Melatonin TABS 9 mg, 9 mg, Oral, QHS PRN, Kirby-Graham, Beather ArbourKaren J, NP, 9 mg at 06/20/19 2356 .  multivitamin with minerals tablet 1 tablet, 1 tablet, Oral, Daily, Sharolyn DouglasEzenduka, Monica MartinezNkeiruka J, MD .  ondansetron (ZOFRAN) tablet 4 mg, 4 mg, Oral, Q6H PRN **OR** ondansetron (ZOFRAN) injection 4 mg, 4 mg, Intravenous, Q6H PRN, Pahwani, Rinka R, MD .  potassium chloride 10 mEq in 100 mL IVPB, 10 mEq, Intravenous, Q1 Hr x 4, Ezenduka, Monica MartinezNkeiruka J, MD .  potassium chloride SA (K-DUR) CR tablet 40 mEq, 40 mEq, Oral, BID, Briant CedarEzenduka, Nkeiruka J, MD, 40 mEq at 06/21/19 1338 .  protein supplement (ENSURE MAX) liquid, 11 oz, Oral, Daily, Briant CedarEzenduka, Nkeiruka J, MD   Exam: Current vital signs: BP 112/65 (BP Location: Right Arm)   Pulse 75   Temp 98.5 F (36.9 C) (Oral)   Resp 18   Ht 5\' 10"  (1.778 m)   Wt 132.6 kg   SpO2 90%   BMI 41.94 kg/m  Vital signs in last 24 hours: Temp:  [97.8 F (36.6 C)-98.5 F (36.9 C)] 98.5 F (36.9 C) (09/17 1221) Pulse Rate:  [75-87] 75 (09/17 1221) Resp:  [14-20] 18 (09/17 1221) BP: (107-131)/(58-74) 112/65 (09/17 1221) SpO2:  [89 %-98 %] 90 % (09/17 1221) Weight:  [132.6 kg-134.5 kg] 132.6 kg (09/17 0351)  GENERAL: Awake, alert and oriented to person, place and time. Patient  is in NAD.  Patient is able to tell a clear and coherent history HENT: - Normocephalic and atraumatic, no LN, Eyes: 3+mm bilateral and brisk, PERRL LUNGS - Clear to auscultation bilaterally with no wheezes CV - S1S2 RRR, no m/r/g, equal pulses bilaterally. ABDOMEN - Soft, nontender, nondistended with normoactive BS Ext: cool, well perfused, intact peripheral pulses, no edema PSYCH: normal mood and affect  NEURO:  Mental Status: AA&Ox3  Language: speech is clear, no aphasia or dysarthria noted.  Naming, repetition, fluency, and comprehension intact Cranial Nerves: PERRL 3+mm/brisk. EOMI, visual fields full, no facial asymmetry, facial sensation intact, hearing intact,  tongue/uvula/soft palate midline, normal sternocleidomastoid and trapezius muscle strength. No evidence of tongue atrophy or fibrillations Motor: Exam is very inconsistent, states she can not move her legs or arms very well.  She has good strength in all 4 extremities, can hold left arm up with no drift, right arm resists when attempting to lift her arm.  Lower extremities she is able to move both legs horizontally but unable to lift off bed, again with good strength and resistance when lifting legs. Tone: is normal and bulk is normal Sensation- Intact to light touch bilaterally Coordination: FTN intact bilaterally, BLE not tested Gait- deferred  Labs I have reviewed labs in epic and the results pertinent to this consultation are:   CBC    Component Value Date/Time   WBC 15.5 (H) 06/21/2019 0951   RBC 4.59 06/21/2019 0951   HGB 13.3 06/21/2019 0951   HGB 15.3 02/10/2017 1207   HCT 40.8 06/21/2019 0951   HCT 49.1 (H) 02/10/2017 1207   PLT 301 06/21/2019 0951   PLT 267 02/10/2017 1207   MCV 88.9 06/21/2019 0951   MCV 91 02/10/2017 1207   MCH 29.0 06/21/2019 0951   MCHC 32.6 06/21/2019 0951   RDW 16.7 (H) 06/21/2019 0951   RDW 18.5 (H) 02/10/2017 1207   LYMPHSABS 1.8 02/10/2017 1207   MONOABS 0.5 01/30/2017 0401   EOSABS 0.2 02/10/2017 1207   BASOSABS 0.0 02/10/2017 1207    CMP     Component Value Date/Time   NA 139 06/21/2019 0951   NA 141 02/10/2017 1207   K <2.0 (LL) 06/21/2019 0951   CL 103 06/21/2019 0951   CO2 25 06/21/2019 0951   GLUCOSE 150 (H) 06/21/2019 0951   BUN 9 06/21/2019 0951   BUN 13 02/10/2017 1207   CREATININE 1.27 (H) 06/21/2019 0951   CALCIUM 7.7 (L) 06/21/2019 0951   PROT 6.8 06/21/2019 0951   ALBUMIN 2.5 (L) 06/21/2019 0951   AST 23 06/21/2019 0951   ALT 19 06/21/2019 0951   ALKPHOS 104 06/21/2019 0951   BILITOT 0.3 06/21/2019 0951   GFRNONAA 54 (L) 06/21/2019 0951   GFRAA >60 06/21/2019 0951    Lipid Panel  No results found for: CHOL,  TRIG, HDL, CHOLHDL, VLDL, LDLCALC, LDLDIRECT   Assessment:   SADIELYNN ONION is a 38 y.o. female with a past medical history significant for obesity, sleep apnea (noncompliant), current smoker who presented to [facility] ED for bilateral lower extremity weakness that started yesterday am (9/16) at around 1130 when she was attempting to get up from bed to walk to the bathroom. She states she needed to call her mom for help who then called EMS for her.   In ED K was noted to be <2 and Phos 1.7, AKI with Cr 1.26.  ON BMP this am K is still <2.0  Most likely extremity weakness is due  to abnormal electrolytes, vs myelopathy vs radiculopathy  Recommendations: Repeat CMP and Replace electroltyes as needed Neuro checks  PT/OT  Consider getting Lumbar spine MRI if weakness does not improve Neurology will follow  Beulah Gandy, NP   NEUROHOSPITALIST ADDENDUM Performed a face to face diagnostic evaluation.   I have reviewed the contents of history and physical exam as documented by PA/ARNP/Resident and agree with above documentation.  I have discussed and formulated the above plan as documented. Edits to the note have been made as needed.  Generalized weakness, lower extremity significantly weaker than upper extremities (patient did not have significant weakness in both upper extremities during assessment by nurse practitioner, has 4 out of 5 strength bilaterally on my examination)  Weakness likely in the setting of severe hypokalemia of unclear etiology.  Patient's reflexes are intact unlikely to be AIDP.  No sensory involvement, unlikely to be a myelopathy.  Recommendation Correct severe hypokalemia, nephrology consult    Tani Virgo MD [facility] [phone]   If 7pm to 7am, please call on call as listed on AMION.

## 2019-06-22 ENCOUNTER — Encounter (HOSPITAL_COMMUNITY): Payer: Self-pay | Admitting: Surgery

## 2019-06-22 ENCOUNTER — Inpatient Hospital Stay (HOSPITAL_COMMUNITY): Payer: Self-pay

## 2019-06-22 LAB — CBC WITH DIFFERENTIAL/PLATELET
Abs Immature Granulocytes: 0.09 10*3/uL — ABNORMAL HIGH (ref 0.00–0.07)
Basophils Absolute: 0 10*3/uL (ref 0.0–0.1)
Basophils Relative: 0 %
Eosinophils Absolute: 0.3 10*3/uL (ref 0.0–0.5)
Eosinophils Relative: 2 %
HCT: 38.7 % (ref 36.0–46.0)
Hemoglobin: 13 g/dL (ref 12.0–15.0)
Immature Granulocytes: 1 %
Lymphocytes Relative: 24 %
Lymphs Abs: 2.9 10*3/uL (ref 0.7–4.0)
MCH: 29.7 pg (ref 26.0–34.0)
MCHC: 33.6 g/dL (ref 30.0–36.0)
MCV: 88.6 fL (ref 80.0–100.0)
Monocytes Absolute: 0.3 10*3/uL (ref 0.1–1.0)
Monocytes Relative: 3 %
Neutro Abs: 8.5 10*3/uL — ABNORMAL HIGH (ref 1.7–7.7)
Neutrophils Relative %: 70 %
Platelets: 261 10*3/uL (ref 150–400)
RBC: 4.37 MIL/uL (ref 3.87–5.11)
RDW: 17 % — ABNORMAL HIGH (ref 11.5–15.5)
WBC: 12.2 10*3/uL — ABNORMAL HIGH (ref 4.0–10.5)
nRBC: 0 % (ref 0.0–0.2)

## 2019-06-22 LAB — CHLORIDE, URINE, RANDOM: Chloride Urine: 52 mmol/L

## 2019-06-22 LAB — URINALYSIS, ROUTINE W REFLEX MICROSCOPIC
Bilirubin Urine: NEGATIVE
Glucose, UA: NEGATIVE mg/dL
Ketones, ur: NEGATIVE mg/dL
Leukocytes,Ua: NEGATIVE
Nitrite: NEGATIVE
Protein, ur: NEGATIVE mg/dL
Specific Gravity, Urine: 1.006 (ref 1.005–1.030)
pH: 6 (ref 5.0–8.0)

## 2019-06-22 LAB — BASIC METABOLIC PANEL
Anion gap: 10 (ref 5–15)
Anion gap: 10 (ref 5–15)
BUN: 9 mg/dL (ref 6–20)
BUN: 9 mg/dL (ref 6–20)
CO2: 27 mmol/L (ref 22–32)
CO2: 27 mmol/L (ref 22–32)
Calcium: 7.9 mg/dL — ABNORMAL LOW (ref 8.9–10.3)
Calcium: 8.1 mg/dL — ABNORMAL LOW (ref 8.9–10.3)
Chloride: 105 mmol/L (ref 98–111)
Chloride: 105 mmol/L (ref 98–111)
Creatinine, Ser: 1.2 mg/dL — ABNORMAL HIGH (ref 0.44–1.00)
Creatinine, Ser: 1.24 mg/dL — ABNORMAL HIGH (ref 0.44–1.00)
GFR calc Af Amer: >60 mL/min (ref >60)
GFR calc Af Amer: >60 mL/min (ref >60)
GFR calc non Af Amer: 55 mL/min — ABNORMAL LOW (ref >60)
GFR calc non Af Amer: 58 mL/min — ABNORMAL LOW (ref >60)
Glucose, Bld: 105 mg/dL — ABNORMAL HIGH (ref 70–99)
Glucose, Bld: 106 mg/dL — ABNORMAL HIGH (ref 70–99)
Potassium: 2 mmol/L — CL (ref 3.5–5.1)
Potassium: <2 mmol/L — CL (ref 3.5–5.1)
Sodium: 142 mmol/L (ref 135–145)
Sodium: 142 mmol/L (ref 135–145)

## 2019-06-22 LAB — PROTEIN / CREATININE RATIO, URINE
Creatinine, Urine: 27.61 mg/dL
Protein Creatinine Ratio: 1.77 mg/mg{Cre} — ABNORMAL HIGH (ref 0.00–0.15)
Total Protein, Urine: 49 mg/dL

## 2019-06-22 LAB — NA AND K (SODIUM & POTASSIUM), RAND UR
Potassium Urine: 10 mmol/L
Sodium, Ur: 43 mmol/L

## 2019-06-22 LAB — GLUCOSE, CAPILLARY: Glucose-Capillary: 125 mg/dL — ABNORMAL HIGH (ref 70–99)

## 2019-06-22 LAB — POTASSIUM: Potassium: 2.7 mmol/L — CL (ref 3.5–5.1)

## 2019-06-22 LAB — MAGNESIUM: Magnesium: 1.9 mg/dL (ref 1.7–2.4)

## 2019-06-22 LAB — PROCALCITONIN: Procalcitonin: 0.2 ng/mL

## 2019-06-22 MED ORDER — POTASSIUM CHLORIDE 10 MEQ/100ML IV SOLN
10.0000 meq | INTRAVENOUS | Status: AC
  Administered 2019-06-22 (×4): 10 meq via INTRAVENOUS
  Filled 2019-06-22: qty 100

## 2019-06-22 MED ORDER — SODIUM CHLORIDE 0.9% FLUSH: 10.0000 mL | INTRAVENOUS | Status: DC | PRN

## 2019-06-22 MED ORDER — POTASSIUM CHLORIDE 10 MEQ/100ML IV SOLN
INTRAVENOUS | Status: AC
  Administered 2019-06-22: 10 meq
  Filled 2019-06-22: qty 100

## 2019-06-22 MED ORDER — MAGNESIUM SULFATE 2 GM/50ML IV SOLN
2.0000 g | Freq: Once | INTRAVENOUS | Status: AC
  Administered 2019-06-22: 2 g via INTRAVENOUS
  Filled 2019-06-22: qty 50

## 2019-06-22 MED ORDER — ENOXAPARIN SODIUM 80 MG/0.8ML ~~LOC~~ SOLN
65.0000 mg | SUBCUTANEOUS | Status: DC
  Administered 2019-06-22: 65 mg via SUBCUTANEOUS
  Filled 2019-06-22: qty 0.8

## 2019-06-22 MED ORDER — POTASSIUM CHLORIDE CRYS ER 20 MEQ PO TBCR
40.0000 meq | EXTENDED_RELEASE_TABLET | Freq: Once | ORAL | Status: AC
  Administered 2019-06-22: 02:00:00 40 meq via ORAL
  Filled 2019-06-22: qty 2

## 2019-06-22 MED ORDER — POTASSIUM CHLORIDE 10 MEQ/100ML IV SOLN
INTRAVENOUS | Status: AC
  Filled 2019-06-22: qty 100

## 2019-06-22 MED ORDER — POTASSIUM CHLORIDE CRYS ER 20 MEQ PO TBCR
40.0000 meq | EXTENDED_RELEASE_TABLET | Freq: Once | ORAL | Status: AC
  Administered 2019-06-22: 40 meq via ORAL
  Filled 2019-06-22: qty 2

## 2019-06-22 MED ORDER — POTASSIUM CHLORIDE CRYS ER 20 MEQ PO TBCR
40.0000 meq | EXTENDED_RELEASE_TABLET | Freq: Three times a day (TID) | ORAL | Status: AC
  Administered 2019-06-22 (×3): 40 meq via ORAL
  Filled 2019-06-22 (×3): qty 2

## 2019-06-22 MED ORDER — POTASSIUM CHLORIDE 10 MEQ/100ML IV SOLN
10.0000 meq | INTRAVENOUS | Status: AC
  Administered 2019-06-22 (×4): 10 meq via INTRAVENOUS
  Filled 2019-06-22 (×4): qty 100

## 2019-06-22 NOTE — Plan of Care (Signed)
  Problem: Nutrition: Goal: Adequate nutrition will be maintained Outcome: Completed/Met   Problem: Coping: Goal: Level of anxiety will decrease Outcome: Completed/Met   Problem: Elimination: Goal: Will not experience complications related to urinary retention Outcome: Completed/Met   Problem: Pain Managment: Goal: General experience of comfort will improve Outcome: Completed/Met

## 2019-06-22 NOTE — Consult Note (Signed)
[facility]  HISTORY AND PHYSICAL  Elizabeth Campbell is an 38 y.o. female.    Chief Complaint:  Weakness  HPI: Pt is a 65 F with obesity, OHS, boacco abuse, and a h/o hydradenitis who is now seen in consultation at the request of Dr. [place] for evaluation and recommendations surrounding profound hypokalemia.  Briefly, pt was in her usual state of health until the day of admission.  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  Noted that her arms and legs were transiently weak.  Thought it resolved; but it occurred again.  On the day of admission, she fell on the floor of her bathroom and was unable to get up.  Presented to the ED where her K was found to be < 2.0.  It has been refractory to supplementation.  It's only 2.0 today.  In this setting we are asked to see.  Pt takes no meds.  No vitamins, supplements, herbal remedies, teas, recreational drugs.  No extreme diets.  Denies f/c, n/v, CP, SOB, LE edema.  Has not taken anything with diuretics in it, including OTC "bloat medicines".  No FH of autoimmune disorders, K-related disorders.  She is at her baseline Cr of 1.2.  No FH of RTAs.  She does not have hypertension.    The only thing she says is that her stools are "runny" sometimes.  She 1-2 large runny stools per day.  This is a chronic occurrence--> has experienced this for some time now.    PMH: Past Medical History:  Diagnosis Date  . Hydradenitis   . Hypokalemia   . Hypomagnesemia   . Obesity (BMI 30-39.9)   . Obesity hypoventilation syndrome ([place])   . Tobacco use    PSH: Past Surgical History:  Procedure Laterality Date  . TONSILLECTOMY      Past Medical History:  Diagnosis Date  . Hydradenitis   . Hypokalemia   . Hypomagnesemia   . Obesity (BMI 30-39.9)   . Obesity hypoventilation syndrome ([place])   . Tobacco use     Medications:   Scheduled: . enoxaparin (LOVENOX) injection  65 mg Subcutaneous Q24H  . multivitamin with minerals  1 tablet Oral Daily  .  potassium chloride  40 mEq Oral TID  . Ensure Max Protein  11 oz Oral Daily    Medications Prior to Admission  Medication Sig Dispense Refill  . acetaminophen (TYLENOL) 500 MG tablet Take 1,000 mg by mouth every 6 (six) hours as needed for mild pain.    . Melatonin 10 MG TABS Take 10 mg by mouth at bedtime.      ALLERGIES:   Allergies  Allergen Reactions  . Advil [Ibuprofen] Hives and Itching    FAM HX: Family History  Problem Relation Age of Onset  . Hypertension Mother   . Cancer Father     Social History:   reports that she has been smoking cigarettes. She has a 3.75 pack-year smoking history. She has never used smokeless tobacco. She reports that she does not drink alcohol or use drugs.  ROS: ROS: all other systems reviewed and are negative except as per HPI  Blood pressure 105/62, pulse 70, temperature 98.2 F (36.8 C), temperature source Oral, resp. rate 18, height 5' 10"  (1.778 m), weight 132.6 kg, SpO2 98 %. PHYSICAL EXAM: Physical Exam  GEN NAD, sitting in bed HEENT EOMI PERRL NECK no JVD PULM clear bilaterally no c/w/r CV RRR no m/r/g ABD soft, nontender, NABS, obese EXT no  LE edema NEURO AAO x 3 nonfocal SKIN warm and dry, no rashes, mult tattoos MSK no effusions   Results for orders placed or performed during the hospital encounter of 06/20/19 (from the past 48 hour(s))  Troponin I (High Sensitivity)     Status: None   Collection Time: 06/20/19  9:39 PM  Result Value Ref Range   Troponin I (High Sensitivity) 15 <18 ng/L    Comment: (NOTE) Elevated high sensitivity troponin I (hsTnI) values and significant  changes across serial measurements may suggest ACS but many other  chronic and acute conditions are known to elevate hsTnI results.  Refer to the "Links" section for chest pain algorithms and additional  guidance. Performed at [facility], [place] [address], [place], [place] [address]   Glucose, capillary     Status: Abnormal   Collection  Time: 06/21/19  6:12 AM  Result Value Ref Range   Glucose-Capillary 120 (H) 70 - 99 mg/dL  Comprehensive metabolic panel     Status: Abnormal   Collection Time: 06/21/19  9:51 AM  Result Value Ref Range   Sodium 139 135 - 145 mmol/L   Potassium <2.0 (LL) 3.5 - 5.1 mmol/L    Comment: CRITICAL RESULT CALLED TO, READ BACK BY AND VERIFIED WITH: K BRACE RN [phone] [id number] BY A BENNETT    Chloride 103 98 - 111 mmol/L   CO2 25 22 - 32 mmol/L   Glucose, Bld 150 (H) 70 - 99 mg/dL   BUN 9 6 - 20 mg/dL   Creatinine, Ser 1.27 (H) 0.44 - 1.00 mg/dL   Calcium 7.7 (L) 8.9 - 10.3 mg/dL   Total Protein 6.8 6.5 - 8.1 g/dL   Albumin 2.5 (L) 3.5 - 5.0 g/dL   AST 23 15 - 41 U/L   ALT 19 0 - 44 U/L   Alkaline Phosphatase 104 38 - 126 U/L   Total Bilirubin 0.3 0.3 - 1.2 mg/dL   GFR calc non Af Amer 54 (L) >60 mL/min   GFR calc Af Amer >60 >60 mL/min   Anion gap 11 5 - 15    Comment: Performed at [facility] [address] [place], [place] [address]  CBC     Status: Abnormal   Collection Time: 06/21/19  9:51 AM  Result Value Ref Range   WBC 15.5 (H) 4.0 - 10.5 K/uL   RBC 4.59 3.87 - 5.11 MIL/uL   Hemoglobin 13.3 12.0 - 15.0 g/dL   HCT 40.8 36.0 - 46.0 %   MCV 88.9 80.0 - 100.0 fL   MCH 29.0 26.0 - 34.0 pg   MCHC 32.6 30.0 - 36.0 g/dL   RDW 16.7 (H) 11.5 - 15.5 %   Platelets 301 150 - 400 K/uL   nRBC 0.0 0.0 - 0.2 %    Comment: Performed at [facility], [place] [address] [place], [address] [address]  Magnesium     Status: None   Collection Time: 06/21/19  9:51 AM  Result Value Ref Range   Magnesium 1.9 1.7 - 2.4 mg/dL    Comment: Performed at [facility] [address] [place], [place] [address]  Basic metabolic panel     Status: Abnormal   Collection Time: 06/21/19  3:49 PM  Result Value Ref Range   Sodium 140 135 - 145 mmol/L   Potassium <2.0 (LL) 3.5 - 5.1 mmol/L    Comment: CRITICAL RESULT CALLED TO, READ BACK BY AND VERIFIED WITH: K.BRACEY,RN  1639 06/21/2019  CLARK,S    Chloride 103 98 - 111 mmol/L   CO2 26 22 - 32 mmol/L   Glucose, Bld 109 (H) 70 - 99 mg/dL   BUN 9 6 - 20 mg/dL   Creatinine, Ser 1.12 (H) 0.44 - 1.00 mg/dL   Calcium 7.8 (L) 8.9 - 10.3 mg/dL   GFR calc non Af Amer >60 >60 mL/min   GFR calc Af Amer >60 >60 mL/min   Anion gap 11 5 - 15    Comment: Performed at [facility] [address] [place], [place] [address]  Procalcitonin - Baseline     Status: None   Collection Time: 06/21/19  3:49 PM  Result Value Ref Range   Procalcitonin 0.35 ng/mL    Comment:        Interpretation: PCT (Procalcitonin) <= 0.5 ng/mL: Systemic infection (sepsis) is not likely. Local bacterial infection is possible. (NOTE)       Sepsis PCT Algorithm           Lower Respiratory Tract                                      Infection PCT Algorithm    ----------------------------     ----------------------------         PCT < 0.25 ng/mL                PCT < 0.10 ng/mL         Strongly encourage             Strongly discourage   discontinuation of antibiotics    initiation of antibiotics    ----------------------------     -----------------------------       PCT 0.25 - 0.50 ng/mL            PCT 0.10 - 0.25 ng/mL               OR       >80% decrease in PCT            Discourage initiation of                                            antibiotics      Encourage discontinuation           of antibiotics    ----------------------------     -----------------------------         PCT >= 0.50 ng/mL              PCT 0.26 - 0.50 ng/mL               AND        <80% decrease in PCT             Encourage initiation of                                             antibiotics       Encourage continuation           of antibiotics    ----------------------------     -----------------------------        PCT >= 0.50 ng/mL  PCT > 0.50 ng/mL               AND         increase in PCT                  Strongly encourage                                       initiation of antibiotics    Strongly encourage escalation           of antibiotics                                     -----------------------------                                           PCT <= 0.25 ng/mL                                                 OR                                        > 80% decrease in PCT                                     Discontinue / Do not initiate                                             antibiotics Performed at [facility], [address], [place], [place] [address]   Basic metabolic panel     Status: Abnormal   Collection Time: 06/21/19 11:53 PM  Result Value Ref Range   Sodium 142 135 - 145 mmol/L   Potassium <2.0 (LL) 3.5 - 5.1 mmol/L    Comment: CRITICAL RESULT CALLED TO, READ BACK BY AND VERIFIED WITH: FUTRELL,M RN 06/22/2019 0053 JORDANS    Chloride 105 98 - 111 mmol/L   CO2 27 22 - 32 mmol/L   Glucose, Bld 106 (H) 70 - 99 mg/dL   BUN 9 6 - 20 mg/dL   Creatinine, Ser 1.24 (H) 0.44 - 1.00 mg/dL   Calcium 7.9 (L) 8.9 - 10.3 mg/dL   GFR calc non Af Amer 55 (L) >60 mL/min   GFR calc Af Amer >60 >60 mL/min   Anion gap 10 5 - 15    Comment: Performed at [facility], [place] [address] [place], [place] [address]  Procalcitonin     Status: None   Collection Time: 06/22/19  4:37 AM  Result Value Ref Range   Procalcitonin 0.20 ng/mL    Comment:        Interpretation: PCT (Procalcitonin) <= 0.5 ng/mL: Systemic infection (sepsis) is not likely. Local bacterial infection is possible. (NOTE)  Sepsis PCT Algorithm           Lower Respiratory Tract                                      Infection PCT Algorithm    ----------------------------     ----------------------------         PCT < 0.25 ng/mL                PCT < 0.10 ng/mL         Strongly encourage             Strongly discourage   discontinuation of antibiotics    initiation of antibiotics    ----------------------------      -----------------------------       PCT 0.25 - 0.50 ng/mL            PCT 0.10 - 0.25 ng/mL               OR       >80% decrease in PCT            Discourage initiation of                                            antibiotics      Encourage discontinuation           of antibiotics    ----------------------------     -----------------------------         PCT >= 0.50 ng/mL              PCT 0.26 - 0.50 ng/mL               AND        <80% decrease in PCT             Encourage initiation of                                             antibiotics       Encourage continuation           of antibiotics    ----------------------------     -----------------------------        PCT >= 0.50 ng/mL                  PCT > 0.50 ng/mL               AND         increase in PCT                  Strongly encourage                                      initiation of antibiotics    Strongly encourage escalation           of antibiotics                                     -----------------------------  PCT <= 0.25 ng/mL                                                 OR                                        > 80% decrease in PCT                                     Discontinue / Do not initiate                                             antibiotics Performed at [facility], [place] [address] [place], [place] [address]   CBC with Differential/Platelet     Status: Abnormal   Collection Time: 06/22/19  4:37 AM  Result Value Ref Range   WBC 12.2 (H) 4.0 - 10.5 K/uL   RBC 4.37 3.87 - 5.11 MIL/uL   Hemoglobin 13.0 12.0 - 15.0 g/dL   HCT 38.7 36.0 - 46.0 %   MCV 88.6 80.0 - 100.0 fL   MCH 29.7 26.0 - 34.0 pg   MCHC 33.6 30.0 - 36.0 g/dL   RDW 17.0 (H) 11.5 - 15.5 %   Platelets 261 150 - 400 K/uL   nRBC 0.0 0.0 - 0.2 %   Neutrophils Relative % 70 %   Neutro Abs 8.5 (H) 1.7 - 7.7 K/uL   Lymphocytes Relative 24 %   Lymphs Abs 2.9 0.7 - 4.0 K/uL   Monocytes  Relative 3 %   Monocytes Absolute 0.3 0.1 - 1.0 K/uL   Eosinophils Relative 2 %   Eosinophils Absolute 0.3 0.0 - 0.5 K/uL   Basophils Relative 0 %   Basophils Absolute 0.0 0.0 - 0.1 K/uL   Immature Granulocytes 1 %   Abs Immature Granulocytes 0.09 (H) 0.00 - 0.07 K/uL    Comment: Performed at [facility] [address], [place], [place] [address]  Basic metabolic panel     Status: Abnormal   Collection Time: 06/22/19  4:37 AM  Result Value Ref Range   Sodium 142 135 - 145 mmol/L   Potassium 2.0 (LL) 3.5 - 5.1 mmol/L    Comment: CRITICAL RESULT CALLED TO, READ BACK BY AND VERIFIED WITH: Mylinda Latina [phone] [id number] [facility]    Chloride 105 98 - 111 mmol/L   CO2 27 22 - 32 mmol/L   Glucose, Bld 105 (H) 70 - 99 mg/dL   BUN 9 6 - 20 mg/dL   Creatinine, Ser 1.20 (H) 0.44 - 1.00 mg/dL   Calcium 8.1 (L) 8.9 - 10.3 mg/dL   GFR calc non Af Amer 58 (L) >60 mL/min   GFR calc Af Amer >60 >60 mL/min   Anion gap 10 5 - 15    Comment: Performed at [facility], [place] [address], [place], [place] [address]  Glucose, capillary     Status: Abnormal   Collection Time: 06/22/19  6:45 AM  Result Value Ref Range   Glucose-Capillary 125 (H) 70 -  99 mg/dL   Comment 1 Notify RN    Comment 2 Document in Chart   Magnesium     Status: None   Collection Time: 06/22/19  7:31 AM  Result Value Ref Range   Magnesium 1.9 1.7 - 2.4 mg/dL    Comment: Performed at [facility], [place] [address], [place], [place] [address]  Potassium     Status: Abnormal   Collection Time: 06/22/19  3:16 PM  Result Value Ref Range   Potassium 2.7 (LL) 3.5 - 5.1 mmol/L    Comment: CRITICAL RESULT CALLED TO, READ BACK BY AND VERIFIED WITH: BRACEY,K RN @ [id number] 06/22/19 LEONARD,A Performed at [facility], [place] [address], [place], [place] [address]   Na and K (sodium & potassium), rand urine     Status: None   Collection Time: 06/22/19  4:30 PM  Result Value Ref Range   Sodium, Ur 43 mmol/L   Potassium Urine  10 mmol/L    Comment: Performed at [facility] [address], [place], [place] [address]  Chloride, urine, random     Status: None   Collection Time: 06/22/19  4:40 PM  Result Value Ref Range   Chloride Urine 52 mmol/L    Comment: Performed at [facility] [address] [place], [place] [address]  Urinalysis, Routine w reflex microscopic     Status: Abnormal   Collection Time: 06/22/19  4:40 PM  Result Value Ref Range   Color, Urine STRAW (A) YELLOW   APPearance CLEAR CLEAR   Specific Gravity, Urine 1.006 1.005 - 1.030   pH 6.0 5.0 - 8.0   Glucose, UA NEGATIVE NEGATIVE mg/dL   Hgb urine dipstick MODERATE (A) NEGATIVE   Bilirubin Urine NEGATIVE NEGATIVE   Ketones, ur NEGATIVE NEGATIVE mg/dL   Protein, ur NEGATIVE NEGATIVE mg/dL   Nitrite NEGATIVE NEGATIVE   Leukocytes,Ua NEGATIVE NEGATIVE   RBC / HPF 0-5 0 - 5 RBC/hpf   WBC, UA 0-5 0 - 5 WBC/hpf   Bacteria, UA RARE (A) NONE SEEN   Squamous Epithelial / LPF 0-5 0 - 5    Comment: Performed at [facility], [address], [place], [place] [address]  Protein / creatinine ratio, urine     Status: Abnormal   Collection Time: 06/22/19  4:40 PM  Result Value Ref Range   Creatinine, Urine 27.61 mg/dL   Total Protein, Urine 49 mg/dL    Comment: NO NORMAL RANGE ESTABLISHED FOR THIS TEST   Protein Creatinine Ratio 1.77 (H) 0.00 - 0.15 mg/mg[Cre]    Comment: Performed at [facility], [place] [address], [place], [place] [address]    Dg Chest Port 1 View  Result Date: 06/21/2019 CLINICAL DATA:  Leukocytosis EXAM: PORTABLE CHEST 1 VIEW COMPARISON:  01/28/2017 FINDINGS: Cardiomegaly. Both lungs are clear. Chronic fracture deformity of the left clavicle. IMPRESSION: Cardiomegaly without acute abnormality of the lungs in AP portable projection. Electronically Signed   By: Eddie Candle M.D.   On: 06/21/2019 12:09    Assessment/Plan  1.  Severe hypokalemia: unclear etiology at present.  Slowly increasing with  aggressive K repletion--> agree with current.  She has 1-2 loose stools daily which I suspect is the cause of her hypokalemia.  Urine K is 10, reflecting renal conservation of K.  Interestingly, Urine Na and Cl are essentially WNL.  This could be due to IVF administration before collection.  Interestingly, pt also has hypophosphatemia.  TSH  and A1c are OK.  Albumin is low.  I don't suspect an RTA since her bicarb on chemistries is slightly alkalotic; Fanconi's syndrome is also ruled out.  Hypokalemic periodic paralysis would be unusual as usually is diagnosed in childhood/ adolescence.  I would expect hypertension with primary hyperaldo.  Bartter's and Gitelman's syndrome have increased urinary K losses (but present with hypoK, met alkalosis, hypomag)--> this is not seen here but I supposed could be confounded with diarrhea.  I will add on urine Ca, Mag, and Phos.  If nothing pans out there we may have to consider malabsorptive states which are increasing stool losses.  Checking a CK and an SPEP as well as PTH Vit D given hypophosphatemia.  Renal US to look for nephrocalcinosis.  2.  OHS/ OSA: needs CPAP  3.  Tobacco abuse- needs to quit.     Madelon Lips 06/22/2019, 7:55 PM

## 2019-06-22 NOTE — Progress Notes (Signed)
PROGRESS NOTE    Elizabeth Campbell  QMV:[id number] DOB: 27-Aug-1981 DOA: 06/20/2019 PCP: Marcine MatarJohnson, Deborah B, MD    Brief Narrative:   Zemirah L Phillipsis a 38 y.o.femalewith medical history significant ofobesity, tobacco abuse, sleep apnea(noncompliant with CPAP machine)obesity hypoventilation syndrome presents to emergency department due to weakness in bilateral lower extremities and inability to move them X 1 day. Pt drinks 4 to 5 cans of Dr. Reino KentPepper per day,smokes half pack of cigarettes per day, denies alcohol, illicit drug use. She lives with her mom at home and independent on daily life activities. She is CNA at [employer]. In the ED, patient was found to have severe hypokalemia with potassium level less than 2.  Patient admitted for further management.   Assessment & Plan:   Active Problems:   Obstructive sleep apnea   Obesity (BMI 30-39.9)   Obesity hypoventilation syndrome (HCC)   Tobacco use   Hypokalemia   Hypomagnesemia   Lower extremity weakness   Acute kidney injury (HCC)   Hyperglycemia   Leukocytosis   Hypokalemic periodic paralysis   Severe hypokalemia Hypomagnesemia Patient presenting with bilateral lower extremity weakness and was found to have a potassium level less than 2.0 on admission.  Patient denies any significant GI loss including diarrhea.  Not on diuretic therapy, and unlikely adrenal adenoma since patient is normotensive.  Urinalysis bland with a pH of 6.0.  Given no clear etiology for her significant hypokalemia, considerations for RTA type I, Little versus partial versus Gettleman syndrome. --Nephrology consulted for assistance given her severe hypokalemia without source --Ordered urine sodium, chloride, potassium to evaluate urinary anion gap --Continue aggressive IV/p.o. replacement of potassium and magnesium today --Continue to monitor on telemetry --Follow electrolytes closely daily  Bilateral lower extremity weakness Patient  presents with acute progression of bilateral lower extremity weakness over 1 day.  Patient at baseline independent with all ADLs.  Patient was attempting to walk to her bathroom on 06/20/2019 at roughly 1130 when she was unable to get up from bed in which her mother called EMS for assistance.  TSH 1.206, within normal limits.  Patient was noted to have severe hypokalemia with a potassium of less than 2.0 on admission.  Neurology was consulted for evaluation and further recommendations.  Neurology believes her lower extremity weakness is likely secondary to abnormal electrolytes and recommended aggressive replenishment.  No further recommendations at that time. --Bilateral lower extremity weakness improving, now able to ambulate without assistance --Continue PT/OT efforts --Electrolyte replacement as above  Acute renal failure Creatinine noted on admission 1.26.  Baseline 0.83 02/10/2017. --Cr 1.26-->1.12-->1.20 --UOP 1.7 L with 4 unmeasured recurrences past 24 hours --Strict I's and O's --If creatinine does not continue to improve, may consider renal ultrasound for further evaluation. --Daily BMP  Leukocytosis WBC count 14.7 on admission.  Unclear etiology, consider reactive.  No infectious source elucidated.  X-ray without acute cardiopulmonary process.  Urinalysis unremarkable.  Afebrile. --WBC 14.7-->15.5-->12.2 --PCT 0.35-->0.20 --continue to hold off antibiotics --Follow CBC daily  OSA/obesity hypoventilation syndrome/history of pulmonary hypertension --Nocturnal CPAP  Prediabetes Hemoglobin A1c 6.0.  Counseled need for lifestyle modification.  Tobacco use disorder Patient counseled need for cessation.  Morbid obesity BMI 41.94.  Discussed with patient need for lifestyle changes and aggressive weight loss as this complicates all facets of care.   DVT prophylaxis: Lovenox Code Status: Full code Family Communication: None present at bedside Disposition Plan: Anticipate discharge  home when electrolytes stable and medically ready.   Consultants:  Nephrology - Dr. Signe Colt  Procedures:   None  Antimicrobials:   None   Subjective: Patient seen and examined at bedside, resting comfortably.  States weakness has almost relatively resolved.  Now able to ambulate to the bathroom and around the room without assistance.  Awaiting to further work with PT/OT today.  Continues with significant electrolyte disturbance.  No other specific complaints today.  Denies headache, no fever/chills/night sweats, no dizziness, no visual changes, no chest pain, palpitations, no shortness of breath, no nausea/vomiting/diarrhea, no abdominal pain, no fatigue, no paresthesias.  No acute events overnight per nursing staff.  Objective: Vitals:   06/21/19 2202 06/22/19 0112 06/22/19 0500 06/22/19 1005  BP: 95/60 122/64 128/64 105/62  Pulse: 75 67 70   Resp: 20 18 18 18   Temp: 98.4 F (36.9 C) 98.2 F (36.8 C) (!) 97.5 F (36.4 C) 98.2 F (36.8 C)  TempSrc: Oral Oral Oral Oral  SpO2: 99% 98% 100% 98%  Weight:      Height:        Intake/Output Summary (Last 24 hours) at 06/22/2019 1400 Last data filed at 06/22/2019 1016 Gross per 24 hour  Intake 2604 ml  Output 2850 ml  Net -246 ml   Filed Weights   06/20/19 2158 06/21/19 0351  Weight: 134.5 kg 132.6 kg    Examination:  General exam: Appears calm and comfortable, obese  Respiratory system: Clear to auscultation. Respiratory effort normal. Cardiovascular system: S1 & S2 heard, RRR. No JVD, murmurs, rubs, gallops or clicks. No pedal edema. Gastrointestinal system: Abdomen is nondistended, soft and nontender. No organomegaly or masses felt. Normal bowel sounds heard. Central nervous system: Alert and oriented. No focal neurological deficits. Extremities: Symmetric 5 x 5 power. Skin: No rashes, lesions or ulcers Psychiatry: Judgement and insight appear normal. Mood & affect appropriate.     Data Reviewed: I have personally  reviewed following labs and imaging studies  CBC: Recent Labs  Lab 06/20/19 1257 06/21/19 0951 06/22/19 0437  WBC 14.7* 15.5* 12.2*  NEUTROABS  --   --  8.5*  HGB 14.1 13.3 13.0  HCT 44.5 40.8 38.7  MCV 89.9 88.9 88.6  PLT 336 301 261   Basic Metabolic Panel: Recent Labs  Lab 06/20/19 1257 06/20/19 1353 06/20/19 1525 06/21/19 0951 06/21/19 1549 06/21/19 2353 06/22/19 0437 06/22/19 0731  NA 137  --   --  139 140 142 142  --   K <2.0*  --   --  <2.0* <2.0* <2.0* 2.0*  --   CL 101  --   --  103 103 105 105  --   CO2 22  --   --  25 26 27 27   --   GLUCOSE 164*  --   --  150* 109* 106* 105*  --   BUN 10  --   --  9 9 9 9   --   CREATININE 1.26*  --   --  1.27* 1.12* 1.24* 1.20*  --   CALCIUM 8.6*  --   --  7.7* 7.8* 7.9* 8.1*  --   MG  --  1.7  --  1.9  --   --   --  1.9  PHOS  --   --  1.7*  --   --   --   --   --    GFR: Estimated Creatinine Clearance: 95.4 mL/min (A) (by C-G formula based on SCr of 1.2 mg/dL (H)). Liver Function Tests: Recent Labs  Lab 06/21/19 [id number]  AST 23  ALT 19  ALKPHOS 104  BILITOT 0.3  PROT 6.8  ALBUMIN 2.5*   No results for input(s): LIPASE, AMYLASE in the last 168 hours. No results for input(s): AMMONIA in the last 168 hours. Coagulation Profile: No results for input(s): INR, PROTIME in the last 168 hours. Cardiac Enzymes: No results for input(s): CKTOTAL, CKMB, CKMBINDEX, TROPONINI in the last 168 hours. BNP (last 3 results) No results for input(s): PROBNP in the last 8760 hours. HbA1C: Recent Labs    06/20/19 1525  HGBA1C 6.0*   CBG: Recent Labs  Lab 06/20/19 1345 06/21/19 0612 06/22/19 0645  GLUCAP 141* 120* 125*   Lipid Profile: No results for input(s): CHOL, HDL, LDLCALC, TRIG, CHOLHDL, LDLDIRECT in the last 72 hours. Thyroid Function Tests: Recent Labs    06/20/19 1525  TSH 1.206   Anemia Panel: No results for input(s): VITAMINB12, FOLATE, FERRITIN, TIBC, IRON, RETICCTPCT in the last 72 hours. Sepsis Labs:  Recent Labs  Lab 06/21/19 1549 06/22/19 0437  PROCALCITON 0.35 0.20    Recent Results (from the past 240 hour(s))  SARS Coronavirus 2 [facility](Hospital order, Performed in [facility] lab) Nasopharyngeal Nasopharyngeal Swab     Status: None   Collection Time: 06/20/19  3:36 PM   Specimen: Nasopharyngeal Swab  Result Value Ref Range Status   SARS Coronavirus 2 NEGATIVE NEGATIVE Final    Comment: (NOTE) If result is NEGATIVE SARS-CoV-2 target nucleic acids are NOT DETECTED. The SARS-CoV-2 RNA is generally detectable in upper and lower  respiratory specimens during the acute phase of infection. The lowest  concentration of SARS-CoV-2 viral copies this assay can detect is 250  copies / mL. A negative result does not preclude SARS-CoV-2 infection  and should not be used as the sole basis for treatment or other  patient management decisions.  A negative result may occur with  improper specimen collection / handling, submission of specimen other  than nasopharyngeal swab, presence of viral mutation(s) within the  areas targeted by this assay, and inadequate number of viral copies  (<250 copies / mL). A negative result must be combined with clinical  observations, patient history, and epidemiological information. If result is POSITIVE SARS-CoV-2 target nucleic acids are DETECTED. The SARS-CoV-2 RNA is generally detectable in upper and lower  respiratory specimens dur ing the acute phase of infection.  Positive  results are indicative of active infection with SARS-CoV-2.  Clinical  correlation with patient history and other diagnostic information is  necessary to determine patient infection status.  Positive results do  not rule out bacterial infection or co-infection with other viruses. If result is PRESUMPTIVE POSTIVE SARS-CoV-2 nucleic acids MAY BE PRESENT.   A presumptive positive result was obtained on the submitted specimen  and confirmed on repeat testing.  While 2019 novel  coronavirus  (SARS-CoV-2) nucleic acids may be present in the submitted sample  additional confirmatory testing may be necessary for epidemiological  and / or clinical management purposes  to differentiate between  SARS-CoV-2 and other Sarbecovirus currently known to infect humans.  If clinically indicated additional testing with an alternate test  methodology [phone]([id number]) is advised. The SARS-CoV-2 RNA is generally  detectable in upper and lower respiratory sp ecimens during the acute  phase of infection. The expected result is Negative. Fact Sheet for Patients:  BoilerBrush.com.cyhttps://www.fda.gov/media/136312/download Fact Sheet for Healthcare Providers: https://pope.com/https://www.fda.gov/media/136313/download This test is not yet approved or cleared by the [place] FDA and has been authorized for detection and/or diagnosis of SARS-CoV-2  by FDA under an Emergency Use Authorization (EUA).  This EUA will remain in effect (meaning this test can be used) for the duration of the COVID-19 declaration under Section 564(b)(1) of the Act, 21 U.S.C. section 360bbb-3(b)(1), unless the authorization is terminated or revoked sooner. Performed at [facility], [place] [address], [place], [place] [address]          Radiology Studies: Dg Chest Port 1 View  Result Date: 06/21/2019 CLINICAL DATA:  Leukocytosis EXAM: PORTABLE CHEST 1 VIEW COMPARISON:  01/28/2017 FINDINGS: Cardiomegaly. Both lungs are clear. Chronic fracture deformity of the left clavicle. IMPRESSION: Cardiomegaly without acute abnormality of the lungs in AP portable projection. Electronically Signed   By: Eddie Candle M.D.   On: 06/21/2019 12:09        Scheduled Meds: . enoxaparin (LOVENOX) injection  65 mg Subcutaneous Q24H  . multivitamin with minerals  1 tablet Oral Daily  . potassium chloride  40 mEq Oral TID  . Ensure Max Protein  11 oz Oral Daily   Continuous Infusions: . 0.9 % NaCl with KCl 40 mEq / L 75 mL/hr (06/21/19 1655)  .  magnesium sulfate bolus IVPB       LOS: 1 day    Time spent: 39 minutes spent on chart review, discussion with nursing staff, consultants, personally reviewing all labs, updating family and interview/physical exam; more than 50% of that time was spent in counseling and/or coordination of care.     J [place] DO [facility] Pager [phone]  If 7PM-7AM, please contact night-coverage [email or web address] Password [email or web address] 06/22/2019, 2:00 PM

## 2019-06-22 NOTE — Progress Notes (Addendum)
NEURO HOSPITALIST PROGRESS NOTE   Subjective: Patient in bed awake alert, NAD. She was able to walk to bathroom and chair. C/o no leg weakness.   Exam: Vitals:   06/22/19 0500 06/22/19 1005  BP: 128/64 105/62  Pulse: 70   Resp: 18 18  Temp: (!) 97.5 F (36.4 C) 98.2 F (36.8 C)  SpO2: 100% 98%    Physical Exam   HEENT-  Normocephalic, no lesions, without obvious abnormality.  Normal external eye and conjunctiva.   Cardiovascular- S1-S2 audible, pulses palpable throughout   Lungs-no rhonchi or wheezing noted, no excessive working breathing.  Saturations within normal limits Abdomen- All 4 quadrants palpated and nontender Extremities- Warm, dry and intact Musculoskeletal-no joint tenderness, deformity or swelling Skin-warm and dry, no hyperpigmentation, vitiligo, or suspicious lesions   Neuro:  Mental Status: Alert, oriented, thought content appropriate.  Speech fluent without evidence of aphasia.  Able to follow commands without difficulty. Cranial Nerves: XL:KGMWNUI:Visual fields grossly normal,  III,IV, VI: ptosis not present, extra-ocular motions intact bilaterally pupils equal, round, reactive to light and accommodation V,VII: smile symmetric, facial light touch sensation normal bilaterally VIII: hearing normal bilaterally IX,X: uvula rises symmetrically XI: bilateral shoulder shrug XII: midline tongue extension Motor: Right : Upper extremity   5/5  Left:     Upper extremity   5/5  Lower extremity   4/5   Lower extremity   4/5 Tone and bulk:normal tone throughout; no atrophy noted Sensory:  light touch intact throughout, bilaterally Deep Tendon Reflexes: 2+ and symmetric biceps, patella Plantars: Right: downgoing   Left: downgoing Cerebellar: normal finger-to-nose, normal rapid alternating movements and normal heel-to-shin test Gait: deferred    Medications:  Scheduled: . enoxaparin (LOVENOX) injection  65 mg Subcutaneous Q24H  . multivitamin  with minerals  1 tablet Oral Daily  . potassium chloride  40 mEq Oral TID  . Ensure Max Protein  11 oz Oral Daily   Continuous: . 0.9 % NaCl with KCl 40 mEq / L 75 mL/hr (06/21/19 1655)  . magnesium sulfate bolus IVPB    . potassium chloride     UVO:[id number]:acetaminophen **OR** acetaminophen, Melatonin, ondansetron **OR** ondansetron (ZOFRAN) IV  Pertinent Labs/Diagnostics:   Dg Chest Port 1 View  Result Date: 06/21/2019 CLINICAL DATA:  Leukocytosis EXAM: PORTABLE CHEST 1 VIEW COMPARISON:  01/28/2017 FINDINGS: Cardiomegaly. Both lungs are clear. Chronic fracture deformity of the left clavicle. IMPRESSION: Cardiomegaly without acute abnormality of the lungs in AP portable projection. Electronically Signed   By: Lauralyn PrimesAlex  Bibbey M.D.   On: 06/21/2019 12:09   Assessment:   Lodema PilotJodie L Moorehead is a [age] y.o. female with a past medical history significant for obesity, sleep apnea (noncompliant), current smoker who [facility] for bilateral lower extremity weakness that started yesterday am (9/16) at around 1130 when she was attempting to get up from bed to walk to the bathroom. She states she needed to call her mom for help who then called EMS for her.   In ED K was noted to be <2 and Phos 1.7, AKI with Cr 1.26.  ON BMP this am K is still <2.0  Weakness related to severe hypokalemia  Recommendations: Continue to correct hypokalemia Neuro checks  PT/OT  Neurology to sign off at this time, please call with any further questions or concerns.       06/22/2019, 10:36 AM  NEUROHOSPITALIST ADDENDUM Performed a face to face diagnostic evaluation.   I have reviewed the contents of history and physical exam as documented by PA/ARNP/Resident and agree with above documentation.  I have discussed and formulated the above plan as documented. Edits to the note have been made as needed.   Patient strength is improved and she is now ambulating without support.  Potassium still below 2.  Continues to  receive IV replacement.  Undergoing evaluation for cause for severe hypokalemia. Neurology will be available as needed.    Karena Addison Laquitta Dominski MD [facility] [phone]   If 7pm to 7am, please call on call as listed on AMION.

## 2019-06-22 NOTE — Progress Notes (Signed)
K less than 2, pt is getting IV K runs still, MD notified, will continue to monitor, Thanks Arvella Nigh RN.

## 2019-06-22 NOTE — Evaluation (Signed)
Occupational Therapy Evaluation Patient Details Name: Elizabeth Campbell MRN: [id number] DOB: 11/20/1980 Today's Date: 06/22/2019    History of Present Illness 38 yo admitted with sudden onset bil UE and bil LE weakness with profound hypokalemia. PMhx: obesity, sleep apnea, hidradenitis   Clinical Impression   Pt is at independent baseline level of function. Pt able to get in and out of bed, ambulate, transfer to toilet and shower, perform selfcare without assist or an AD. All education completed and no further acute OT is indicated at this time    Follow Up Recommendations  No OT follow up    Equipment Recommendations  None recommended by OT    Recommendations for Other Services       Precautions / Restrictions Precautions Precautions: Fall Restrictions Weight Bearing Restrictions: No      Mobility Bed Mobility Overal bed mobility: Needs Assistance Bed Mobility: Supine to Sit;Sit to Supine Rolling: Independent     Sit to supine: Independent      Transfers Overall transfer level: Independent Equipment used: None                  Balance Overall balance assessment: No apparent balance deficits (not formally assessed) Sitting-balance support: Feet unsupported Sitting balance-Leahy Scale: Good     Standing balance support: Single extremity supported;Bilateral upper extremity supported;During functional activity Standing balance-Leahy Scale: Fair                             ADL either performed or assessed with clinical judgement   ADL Overall ADL's : Independent;At baseline                                       General ADL Comments: Pt back to baseline independent level of function     Vision Patient Visual Report: No change from baseline       Perception     Praxis      Pertinent Vitals/Pain Pain Assessment: No/denies pain Pain Intervention(s): Monitored during session     Hand Dominance Right    Extremity/Trunk Assessment Upper Extremity Assessment Upper Extremity Assessment: Overall WFL for tasks assessed   Lower Extremity Assessment Lower Extremity Assessment: Defer to PT evaluation       Communication Communication Communication: No difficulties   Cognition Arousal/Alertness: Awake/alert Behavior During Therapy: WFL for tasks assessed/performed Overall Cognitive Status: Within Functional Limits for tasks assessed                                     General Comments   pt very pleasant and anxious to go home    Exercises     Shoulder Instructions      Home Living Family/patient expects to be discharged to:: Private residence Living Arrangements: Parent;Children Available Help at Discharge: Family;Available PRN/intermittently Type of Home: House Home Access: Stairs to enter Enter[employer] CorporationEntrance Stairs-Number of Steps: 1   Home Layout: One level     Bathroom Shower/Tub: Chief Strategy OfficerTub/shower unit   Bathroom Toilet: Standard     Home Equipment: None          Prior Functioning/Environment Level of Independence: Independent        Comments: works as a LawyerCNA, 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        OT Problem List: Decreased  activity tolerance      OT Treatment/Interventions:      OT Goals(Current goals can be found in the care plan section) Acute Rehab OT Goals Patient Stated Goal: return to independence and work OT Goal Formulation: With patient  OT Frequency:     Barriers to D/C:    no barriers       Co-evaluation              AM-PAC OT "6 Clicks" Daily Activity     Outcome Measure Help from another person eating meals?: None Help from another person taking care of personal grooming?: None Help from another person toileting, which includes using toliet, bedpan, or urinal?: None Help from another person bathing (including washing, rinsing, drying)?: None Help from another person to put on and taking off regular upper body clothing?: None Help  from another person to put on and taking off regular lower body clothing?: None 6 Click Score: 24   End of Session    Activity Tolerance: Patient tolerated treatment well Patient left: in bed  OT Visit Diagnosis: Muscle weakness (generalized) (M62.81)                Time: 6295-2841 OT Time Calculation (min): 15 min Charges:  OT General Charges $OT Visit: 1 Visit OT Evaluation $OT Eval Low Complexity: 1 Low    Britt Bottom 06/22/2019, 11:41 AM

## 2019-06-22 NOTE — Progress Notes (Signed)
Physical Therapy Treatment/ Discharge Patient Details Name: Elizabeth Campbell MRN: [id number] DOB: [birth year] Today's Date: 06/22/2019    History of Present Illness 38 yo admitted with sudden onset bil UE and bil LE weakness with profound hypokalemia. PMhx: obesity, sleep apnea, hidradenitis    PT Comments    Pt seen in conjunction with OT based on presentation from eval. However, pt markedly improved from 9/17 with ability to move all extremities fully, perform transfers, gait and stairs independently and is back to baseline functional status. Pt reports she improved some with mobility last night but after waking this morning had returned to normal function. Pt independent without further therapy needs at this time and will sign off with pt aware and agreeable.     Follow Up Recommendations  No PT follow up     Equipment Recommendations  None recommended by PT    Recommendations for Other Services       Precautions / Restrictions Precautions Precautions: None Restrictions Weight Bearing Restrictions: No    Mobility  Bed Mobility Overal bed mobility: Independent Bed Mobility: Supine to Sit;Sit to Supine Rolling: Independent     Sit to supine: Independent   General bed mobility comments: pt able to transition to sitting and back to supine without assist, bed flat and no rail  Transfers Overall transfer level: Independent Equipment used: None             General transfer comment: from bed and toilet  Ambulation/Gait Ambulation/Gait assistance: Independent Gait Distance (Feet): 500 Feet Assistive device: None Gait Pattern/deviations: WFL(Within Functional Limits)   Gait velocity interpretation: >4.37 ft/sec, indicative of normal walking speed General Gait Details: good speed and  stability   Stairs Stairs: Yes Stairs assistance: Modified independent (Device/Increase time) Stair Management: Alternating pattern;Forwards Number of Stairs: 11      Wheelchair Mobility    Modified Rankin (Stroke Patients Only)       Balance Overall balance assessment: No apparent balance deficits (not formally assessed) Sitting-balance support: Feet unsupported Sitting balance-Leahy Scale: Good     Standing balance support: Single extremity supported;Bilateral upper extremity supported;During functional activity Standing balance-Leahy Scale: Fair                              Cognition Arousal/Alertness: Awake/alert Behavior During Therapy: WFL for tasks assessed/performed Overall Cognitive Status: Within Functional Limits for tasks assessed                                        Exercises      General Comments        Pertinent Vitals/Pain Pain Assessment: No/denies pain Pain Intervention(s): Monitored during session    Home Living Family/patient expects to be discharged to:: Private residence Living Arrangements: Parent;Children Available Help at Discharge: Family;Available PRN/intermittently Type of Home: House Home Access: Stairs to enter   Home Layout: One level Home Equipment: None      Prior Function Level of Independence: Independent      Comments: works as a Quarry manager, 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   PT Goals (current goals can now be found in the care plan section) Acute Rehab PT Goals Patient Stated Goal: return to independence and work Progress towards PT goals: Goals met/education completed, patient discharged from PT    Frequency           PT  Plan Discharge plan needs to be updated    Co-evaluation PT/OT/SLP Co-Evaluation/Treatment: Yes Reason for Co-Treatment: Other (comment)(to address mobility and function based on presentation at eval with improvement in function this session) PT goals addressed during session: Mobility/safety with mobility        AM-PAC PT "6 Clicks" Mobility   Outcome Measure  Help needed turning from your back to your side while in a flat bed  without using bedrails?: None Help needed moving from lying on your back to sitting on the side of a flat bed without using bedrails?: None Help needed moving to and from a bed to a chair (including a wheelchair)?: None Help needed standing up from a chair using your arms (e.g., wheelchair or bedside chair)?: None Help needed to walk in hospital room?: None Help needed climbing 3-5 steps with a railing? : None 6 Click Score: 24    End of Session   Activity Tolerance: Patient tolerated treatment well Patient left: in bed;with call bell/phone within reach Nurse Communication: Mobility status PT Visit Diagnosis: Other abnormalities of gait and mobility (R26.89)     Time: 7127-8718 PT Time Calculation (min) (ACUTE ONLY): 15 min  Charges:                        Elwyn Reach, PT Acute Rehabilitation Services Pager: [phone] Office: [place] 06/22/2019, 11:54 AM

## 2019-06-23 DIAGNOSIS — Z6841 Body Mass Index (BMI) 40.0 and over, adult: Secondary | ICD-10-CM

## 2019-06-23 DIAGNOSIS — G723 Periodic paralysis: Secondary | ICD-10-CM

## 2019-06-23 DIAGNOSIS — E7439 Other disorders of intestinal carbohydrate absorption: Secondary | ICD-10-CM

## 2019-06-23 LAB — COMPREHENSIVE METABOLIC PANEL
ALT: 23 U/L (ref 0–44)
AST: 42 U/L — ABNORMAL HIGH (ref 15–41)
Albumin: 2.5 g/dL — ABNORMAL LOW (ref 3.5–5.0)
Alkaline Phosphatase: 97 U/L (ref 38–126)
Anion gap: 10 (ref 5–15)
BUN: 8 mg/dL (ref 6–20)
CO2: 24 mmol/L (ref 22–32)
Calcium: 8.2 mg/dL — ABNORMAL LOW (ref 8.9–10.3)
Chloride: 106 mmol/L (ref 98–111)
Creatinine, Ser: 1.04 mg/dL — ABNORMAL HIGH (ref 0.44–1.00)
GFR calc Af Amer: >60 mL/min (ref >60)
GFR calc non Af Amer: >60 mL/min (ref >60)
Glucose, Bld: 122 mg/dL — ABNORMAL HIGH (ref 70–99)
Potassium: 3.3 mmol/L — ABNORMAL LOW (ref 3.5–5.1)
Sodium: 140 mmol/L (ref 135–145)
Total Bilirubin: 0.6 mg/dL (ref 0.3–1.2)
Total Protein: 6.9 g/dL (ref 6.5–8.1)

## 2019-06-23 LAB — BASIC METABOLIC PANEL
Anion gap: 7 (ref 5–15)
BUN: 10 mg/dL (ref 6–20)
CO2: 27 mmol/L (ref 22–32)
Calcium: 8 mg/dL — ABNORMAL LOW (ref 8.9–10.3)
Chloride: 105 mmol/L (ref 98–111)
Creatinine, Ser: 1 mg/dL (ref 0.44–1.00)
GFR calc Af Amer: >60 mL/min (ref >60)
GFR calc non Af Amer: >60 mL/min (ref >60)
Glucose, Bld: 97 mg/dL (ref 70–99)
Potassium: 3 mmol/L — ABNORMAL LOW (ref 3.5–5.1)
Sodium: 139 mmol/L (ref 135–145)

## 2019-06-23 LAB — PROCALCITONIN: Procalcitonin: <0.1 ng/mL

## 2019-06-23 LAB — GLUCOSE, CAPILLARY
Glucose-Capillary: 119 mg/dL — ABNORMAL HIGH (ref 70–99)
Glucose-Capillary: 102 mg/dL — ABNORMAL HIGH (ref 70–99)

## 2019-06-23 LAB — MAGNESIUM: Magnesium: 2.1 mg/dL (ref 1.7–2.4)

## 2019-06-23 LAB — CK: Total CK: 705 U/L — ABNORMAL HIGH (ref 38–234)

## 2019-06-23 LAB — VITAMIN B12: Vitamin B-12: 240 pg/mL (ref 180–914)

## 2019-06-23 LAB — PHOSPHORUS: Phosphorus: 3 mg/dL (ref 2.5–4.6)

## 2019-06-23 MED ORDER — VITAMIN B-12 1000 MCG PO TABS
1000.0000 ug | ORAL_TABLET | Freq: Every day | ORAL | Status: DC
  Administered 2019-06-23: 1000 ug via ORAL
  Filled 2019-06-23: qty 1

## 2019-06-23 MED ORDER — CYANOCOBALAMIN 1000 MCG PO TABS: 1000.0000 ug | ORAL_TABLET | Freq: Every day | ORAL | 0 refills | Status: AC

## 2019-06-23 MED ORDER — POTASSIUM CHLORIDE ER 20 MEQ PO TBCR: 20.0000 meq | EXTENDED_RELEASE_TABLET | Freq: Every day | ORAL | 0 refills | Status: DC

## 2019-06-23 MED ORDER — POTASSIUM CHLORIDE CRYS ER 20 MEQ PO TBCR
40.0000 meq | EXTENDED_RELEASE_TABLET | Freq: Once | ORAL | Status: AC
  Administered 2019-06-23: 40 meq via ORAL
  Filled 2019-06-23: qty 2

## 2019-06-23 MED ORDER — POTASSIUM CHLORIDE CRYS ER 20 MEQ PO TBCR
40.0000 meq | EXTENDED_RELEASE_TABLET | ORAL | Status: DC
  Administered 2019-06-23: 40 meq via ORAL
  Filled 2019-06-23: qty 2

## 2019-06-23 NOTE — Discharge Summary (Signed)
Physician Discharge Summary  Elizabeth Campbell WYO:[id number] DOB: 1981-07-13 DOA: 06/20/2019  PCP: Ladell Pier, MD  Admit date: 06/20/2019 Discharge date: 06/23/2019  Admitted From: Home Disposition: Home  Recommendations for Outpatient Follow-up:  1. Follow potassium at the end of this week 2. Continue to encourage weight loss and smoking cessation 3. Continue to follow sugars and recommend carb modified diet as she is glucose intolerant 4. Work-up for chronic leukocytosis recommended if it has not already been done  Discharge Condition: Stable CODE STATUS: Full code Diet recommendation: Heart healthy and carb modified Consultations: Neuro Nephro GI   Discharge Diagnoses:  Principal Problem:   Lower extremity weakness Active Problems:   Obstructive sleep apnea   Class 3 severe obesity due to excess calories with serious comorbidity and body mass index (BMI) of 40.0 to 44.9 in adult [facility])   Tobacco use   Hypokalemia   Hypomagnesemia   Hyperglycemia   Leukocytosis   Diarrhea  Brief Summary: Elizabeth Campbell a 38 y.o.femalewith medical history significant ofobesity, tobacco abuse, sleep apnea(noncompliant with CPAP machine)obesity hypoventilation syndrome presents to emergency department due to weakness in bilateral lower extremities andinability to move them X 1 day. Ptdrinks 4 to 5 cans of Dr. Malachi Bonds per day,smokes half pack of cigarettes per day, denies alcohol, illicit drug use. She lives with her mom at home and independent on daily life activities. She is CNA at [employer].In the ED, patient was found to have severe hypokalemia with potassium level less than 2.  I have seen her for the first time today.  Hospital Course:  Severe hypokalemia - Despite daily replacement her potassium level remained less than 2 until yesterday - Nephrology was consulted due to the ongoing hypokalemia and has done an extensive work-up,  some of which is still  pending - Work-up to be followed: Urine random calcium, urine magnesium, folate RBC, intact PTH and calcium, vitamin D 25 hydroxy, protein electrophoresis, kappa/lambda light chains - As of today, nephrology feels that her low potassium is secondary to GI losses -Potassium is 3.3 today-she has received 80 mEq of potassium and will continue to receive 20 mg daily for another 3 days -She will need to follow-up with her PCP at the end of this week to have her potassium checked  Bilateral lower extremity weakness -Neurology was consulted for this and the determination is that it was most likely secondary to hypokalemia  Complaint of diarrhea - The patient's history varies-she has told some people she has had 2-3 bowel movements a day and she has told others it is only 1 or 2 -She tells me that she has been having 1-2 episodes of diarrhea which are occurring after she eats  -she states she is lactose intolerant and avoids lactose-there is no blood in the stools-there is no abdominal pain or fevers -I did request GI to speak with her today-Dr. Alessandra Bevels states that if significant diarrhea occurs again, she should be tested with a laxative screen- he does not feel she needs any inpatient work-up  Renal insufficiency -Possibly dehydration -Creatinine on admission was 1.26 improved to 1.04  Morbid obesity Body mass index is 42.51 kg/m. -Needs to be continuously encouraged to lose weight  Obstructive sleep apnea  - Needs another sleep study -she previously sent her CPAP back but may be able to tolerate some of the newer masks  Glucose intolerance -Hemoglobin A1c is 6.0  Nicotine abuse She has been advised to stop smoking  Leukocytosis Interestingly, her  WBC count has been elevated around 14- 15 most of the time time it has been checked since 2008- today it is 12 - her differential shows a neutrophil elevation -I am not sure if she has had a work-up for this but this can be done as an  outpatient   Discharge Exam: Vitals:   Jul 03, 2019 0513 07-03-19 1237  BP: (!) 102/51 109/66  Pulse: 81 74  Resp: 18 20  Temp: 98 F (36.7 C) 97.8 F (36.6 C)  SpO2: 92% 97%   Vitals:   06/22/19 1005 06/22/19 2043 Jul 03, 2019 0513 07/03/19 1237  BP: 105/62 112/77 (!) 102/51 109/66  Pulse:  70 81 74  Resp: 18 16 18 20   Temp: 98.2 F (36.8 C) 98.2 F (36.8 C) 98 F (36.7 C) 97.8 F (36.6 C)  TempSrc: Oral Oral Oral Oral  SpO2: 98% 97% 92% 97%  Weight:   134.4 kg   Height:        General: Pt is alert, awake, not in acute distress Cardiovascular: RRR, S1/S2 +, no rubs, no gallops Respiratory: CTA bilaterally, no wheezing, no rhonchi Abdominal: Soft, NT, ND, bowel sounds + Extremities: no edema, no cyanosis   Discharge Instructions  Discharge Instructions    Diet - low sodium heart healthy   Complete by: As directed    Increase activity slowly   Complete by: As directed      Allergies as of 2019/07/03      Reactions   Advil [ibuprofen] Hives, Itching      Medication List    TAKE these medications   acetaminophen 500 MG tablet Commonly known as: TYLENOL Take 1,000 mg by mouth every 6 (six) hours as needed for mild pain.   cyanocobalamin 1000 MCG tablet Take 1 tablet (1,000 mcg total) by mouth daily. Start taking on: June 24, 2019   Melatonin 10 MG Tabs Take 10 mg by mouth at bedtime.   Potassium Chloride ER 20 MEQ Tbcr Take 20 mEq by mouth daily. Start taking on: June 24, 2019      Follow-up Information    Marcine Matar, MD. Call in 6 day(s).   Specialty: Internal Medicine Why: Please see by the end of this week to have your potassium level checked Contact information: [address] [place] [address] [phone]        Wilder Glade, MD Follow up.   Specialty: Family Medicine Why: Obesity medicine clinic Contact information: [address] [place] [address] [phone]          Allergies   Allergen Reactions  . Advil [Ibuprofen] Hives and Itching     Procedures/Studies:  US Renal  Result Date: 2019/07/03 CLINICAL DATA:  Acute kidney injury EXAM: RENAL / URINARY TRACT ULTRASOUND COMPLETE COMPARISON:  August 01, 2007 FINDINGS: Right Kidney: Renal measurements: 13.4 x 5.6 x 6.8 cm = volume: 263 mL. There is a 2.8 x 2.4 x 2.7 cm cyst in the lower pole. There is no hydronephrosis. Left Kidney: Renal measurements: 13 x 5.6 x 5.2 cm = volume: 196 mL. Multiple cysts are noted measuring up to approximately 2.5 cm. Bladder: Appears normal for degree of bladder distention. IMPRESSION: No acute sonographic abnormality detected. Electronically Signed   By: Katherine Mantle M.D.   On: 03-Jul-2019 00:07   Dg Chest Port 1 View  Result Date: 06/21/2019 CLINICAL DATA:  Leukocytosis EXAM: PORTABLE CHEST 1 VIEW COMPARISON:  01/28/2017 FINDINGS: Cardiomegaly. Both lungs are clear. Chronic fracture deformity of  the left clavicle. IMPRESSION: Cardiomegaly without acute abnormality of the lungs in AP portable projection. Electronically Signed   By: Lauralyn PrimesAlex  Bibbey M.D.   On: 06/21/2019 12:09     The results of significant diagnostics from this hospitalization (including imaging, microbiology, ancillary and laboratory) are listed below for reference.     Microbiology: Recent Results (from the past 240 hour(s))  SARS Coronavirus 2 [facility](Hospital order, Performed in [facility] hospital lab) Nasopharyngeal Nasopharyngeal Swab     Status: None   Collection Time: 06/20/19  3:36 PM   Specimen: Nasopharyngeal Swab  Result Value Ref Range Status   SARS Coronavirus 2 NEGATIVE NEGATIVE Final    Comment: (NOTE) If result is NEGATIVE SARS-CoV-2 target nucleic acids are NOT DETECTED. The SARS-CoV-2 RNA is generally detectable in upper and lower  respiratory specimens during the acute phase of infection. The lowest  concentration of SARS-CoV-2 viral copies this assay can detect is 250  copies / mL. A negative  result does not preclude SARS-CoV-2 infection  and should not be used as the sole basis for treatment or other  patient management decisions.  A negative result may occur with  improper specimen collection / handling, submission of specimen other  than nasopharyngeal swab, presence of viral mutation(s) within the  areas targeted by this assay, and inadequate number of viral copies  (<250 copies / mL). A negative result must be combined with clinical  observations, patient history, and epidemiological information. If result is POSITIVE SARS-CoV-2 target nucleic acids are DETECTED. The SARS-CoV-2 RNA is generally detectable in upper and lower  respiratory specimens dur ing the acute phase of infection.  Positive  results are indicative of active infection with SARS-CoV-2.  Clinical  correlation with patient history and other diagnostic information is  necessary to determine patient infection status.  Positive results do  not rule out bacterial infection or co-infection with other viruses. If result is PRESUMPTIVE POSTIVE SARS-CoV-2 nucleic acids MAY BE PRESENT.   A presumptive positive result was obtained on the submitted specimen  and confirmed on repeat testing.  While 2019 novel coronavirus  (SARS-CoV-2) nucleic acids may be present in the submitted sample  additional confirmatory testing may be necessary for epidemiological  and / or clinical management purposes  to differentiate between  SARS-CoV-2 and other Sarbecovirus currently known to infect humans.  If clinically indicated additional testing with an alternate test  methodology [phone]([id number]) is advised. The SARS-CoV-2 RNA is generally  detectable in upper and lower respiratory sp ecimens during the acute  phase of infection. The expected result is Negative. Fact Sheet for Patients:  BoilerBrush.com.cyhttps://www.fda.gov/media/136312/download Fact Sheet for Healthcare Providers: https://pope.com/https://www.fda.gov/media/136313/download This test is not yet  approved or cleared by the [place] FDA and has been authorized for detection and/or diagnosis of SARS-CoV-2 by FDA under an Emergency Use Authorization (EUA).  This EUA will remain in effect (meaning this test can be used) for the duration of the COVID-19 declaration under Section 564(b)(1) of the Act, 21 U.S.C. section 360bbb-3(b)(1), unless the authorization is terminated or revoked sooner. Performed at [facility], [address] [place], [place] [address]      Labs: BNP (last 3 results) No results for input(s): BNP in the last 8760 hours. Basic Metabolic Panel: Recent Labs  Lab 06/20/19 1353 06/20/19 1525 06/21/19 0951 06/21/19 1549 06/21/19 2353 06/22/19 0437 06/22/19 0731 06/22/19 1516 06/22/19 2329 06/23/19 0634  NA  --   --  139 140 142 142  --   --  139 140  K  --   --  <2.0* <2.0* <2.0* 2.0*  --  2.7* 3.0* 3.3*  CL  --   --  103 103 105 105  --   --  105 106  CO2  --   --  25 26 27 27   --   --  27 24  GLUCOSE  --   --  150* 109* 106* 105*  --   --  97 122*  BUN  --   --  9 9 9 9   --   --  10 8  CREATININE  --   --  1.27* 1.12* 1.24* 1.20*  --   --  1.00 1.04*  CALCIUM  --   --  7.7* 7.8* 7.9* 8.1*  --   --  8.0* 8.2*  MG 1.7  --  1.9  --   --   --  1.9  --   --  2.1  PHOS  --  1.7*  --   --   --   --   --   --   --  3.0   Liver Function Tests: Recent Labs  Lab 06/21/19 0951 06/23/19 0634  AST 23 42*  ALT 19 23  ALKPHOS 104 97  BILITOT 0.3 0.6  PROT 6.8 6.9  ALBUMIN 2.5* 2.5*   No results for input(s): LIPASE, AMYLASE in the last 168 hours. No results for input(s): AMMONIA in the last 168 hours. CBC: Recent Labs  Lab 06/20/19 1257 06/21/19 0951 06/22/19 0437  WBC 14.7* 15.5* 12.2*  NEUTROABS  --   --  8.5*  HGB 14.1 13.3 13.0  HCT 44.5 40.8 38.7  MCV 89.9 88.9 88.6  PLT 336 301 261   Cardiac Enzymes: Recent Labs  Lab 06/23/19 0634  CKTOTAL 705*   BNP: Invalid input(s): POCBNP CBG: Recent Labs  Lab 06/20/19 1345  06/21/19 0612 06/22/19 0645 06/23/19 0613 06/23/19 0717  GLUCAP 141* 120* 125* 119* 102*   D-Dimer No results for input(s): DDIMER in the last 72 hours. Hgb A1c No results for input(s): HGBA1C in the last 72 hours. Lipid Profile No results for input(s): CHOL, HDL, LDLCALC, TRIG, CHOLHDL, LDLDIRECT in the last 72 hours. Thyroid function studies No results for input(s): TSH, T4TOTAL, T3FREE, THYROIDAB in the last 72 hours.  Invalid input(s): FREET3 Anemia work up Recent Labs    06/23/19 0634  VITAMINB12 240   Urinalysis    Component Value Date/Time   COLORURINE STRAW (A) 06/22/2019 1640   APPEARANCEUR CLEAR 06/22/2019 1640   LABSPEC 1.006 06/22/2019 1640   PHURINE 6.0 06/22/2019 1640   GLUCOSEU NEGATIVE 06/22/2019 1640   HGBUR MODERATE (A) 06/22/2019 1640   BILIRUBINUR NEGATIVE 06/22/2019 1640   KETONESUR NEGATIVE 06/22/2019 1640   PROTEINUR NEGATIVE 06/22/2019 1640   UROBILINOGEN 1.0 10/08/2009 1824   NITRITE NEGATIVE 06/22/2019 1640   LEUKOCYTESUR NEGATIVE 06/22/2019 1640   Sepsis Labs Invalid input(s): PROCALCITONIN,  WBC,  LACTICIDVEN Microbiology Recent Results (from the past 240 hour(s))  SARS Coronavirus 2 [facility](Hospital order, Performed in [facility] hospital lab) Nasopharyngeal Nasopharyngeal Swab     Status: None   Collection Time: 06/20/19  3:36 PM   Specimen: Nasopharyngeal Swab  Result Value Ref Range Status   SARS Coronavirus 2 NEGATIVE NEGATIVE Final    Comment: (NOTE) If result is NEGATIVE SARS-CoV-2 target nucleic acids are NOT DETECTED. The SARS-CoV-2 RNA is generally detectable in upper and lower  respiratory specimens during the acute phase of infection. The  lowest  concentration of SARS-CoV-2 viral copies this assay can detect is 250  copies / mL. A negative result does not preclude SARS-CoV-2 infection  and should not be used as the sole basis for treatment or other  patient management decisions.  A negative result may occur with  improper  specimen collection / handling, submission of specimen other  than nasopharyngeal swab, presence of viral mutation(s) within the  areas targeted by this assay, and inadequate number of viral copies  (<250 copies / mL). A negative result must be combined with clinical  observations, patient history, and epidemiological information. If result is POSITIVE SARS-CoV-2 target nucleic acids are DETECTED. The SARS-CoV-2 RNA is generally detectable in upper and lower  respiratory specimens dur ing the acute phase of infection.  Positive  results are indicative of active infection with SARS-CoV-2.  Clinical  correlation with patient history and other diagnostic information is  necessary to determine patient infection status.  Positive results do  not rule out bacterial infection or co-infection with other viruses. If result is PRESUMPTIVE POSTIVE SARS-CoV-2 nucleic acids MAY BE PRESENT.   A presumptive positive result was obtained on the submitted specimen  and confirmed on repeat testing.  While 2019 novel coronavirus  (SARS-CoV-2) nucleic acids may be present in the submitted sample  additional confirmatory testing may be necessary for epidemiological  and / or clinical management purposes  to differentiate between  SARS-CoV-2 and other Sarbecovirus currently known to infect humans.  If clinically indicated additional testing with an alternate test  methodology [phone] is advised. The SARS-CoV-2 RNA is generally  detectable in upper and lower respiratory sp ecimens during the acute  phase of infection. The expected result is Negative. Fact Sheet for Patients:  BoilerBrush.com.cy Fact Sheet for Healthcare Providers: https://pope.com/ This test is not yet approved or cleared by the [place] FDA and has been authorized for detection and/or diagnosis of SARS-CoV-2 by FDA under an Emergency Use Authorization (EUA).  This EUA will remain in  effect (meaning this test can be used) for the duration of the COVID-19 declaration under Section 564(b)(1) of the Act, 21 U.S.C. section 360bbb-3(b)(1), unless the authorization is terminated or revoked sooner. Performed at [facility], [address] [place], [place] [address]      Time coordinating discharge in minutes: 65  SIGNED:   Calvert Cantor, MD  [facility] 06/23/2019, 3:36 PM Pager   If 7PM-7AM, please contact night-coverage [email or web address] Password [email or web address]

## 2019-06-23 NOTE — Discharge Instructions (Signed)
Your vitamin B12 level is a little bit low and therefore you have been started on replacement. If your diarrhea recurs, please see your primary care physician. You are prediabetic and need to follow a low carbohydrate diet  to help your sugars improve.  Losing weight can also control your sugars and prevent diabetes. For your health, it is recommended that you try to lose weight through diet and exercise.  The number for the obesity medicine clinic is in your discharge paperwork. For your health, it is recommended that you try to stop smoking.   Diabetes Mellitus and Nutrition, Adult When you have diabetes (diabetes mellitus), it is very important to have healthy eating habits because your blood sugar (glucose) levels are greatly affected by what you eat and drink. Eating healthy foods in the appropriate amounts, at about the same times every day, can help you:  Control your blood glucose.  Lower your risk of heart disease.  Improve your blood pressure.  Reach or maintain a healthy weight. Every person with diabetes is different, and each person has different needs for a meal plan. Your health care provider may recommend that you work with a diet and nutrition specialist (dietitian) to make a meal plan that is best for you. Your meal plan may vary depending on factors such as:  The calories you need.  The medicines you take.  Your weight.  Your blood glucose, blood pressure, and cholesterol levels.  Your activity level.  Other health conditions you have, such as heart or kidney disease. How do carbohydrates affect me? Carbohydrates, also called carbs, affect your blood glucose level more than any other type of food. Eating carbs naturally raises the amount of glucose in your blood. Carb counting is a method for keeping track of how many carbs you eat. Counting carbs is important to keep your blood glucose at a healthy level, especially if you use insulin or take certain oral diabetes  medicines. It is important to know how many carbs you can safely have in each meal. This is different for every person. Your dietitian can help you calculate how many carbs you should have at each meal and for each snack. Foods that contain carbs include:  Bread, cereal, rice, pasta, and crackers.  Potatoes and corn.  Peas, beans, and lentils.  Milk and yogurt.  Fruit and juice.  Desserts, such as cakes, cookies, ice cream, and candy. How does alcohol affect me? Alcohol can cause a sudden decrease in blood glucose (hypoglycemia), especially if you use insulin or take certain oral diabetes medicines. Hypoglycemia can be a life-threatening condition. Symptoms of hypoglycemia (sleepiness, dizziness, and confusion) are similar to symptoms of having too much alcohol. If your health care provider says that alcohol is safe for you, follow these guidelines:  Limit alcohol intake to no more than 1 drink per day for nonpregnant women and 2 drinks per day for men. One drink equals 12 oz of beer, 5 oz of wine, or 1 oz of hard liquor.  Do not drink on an empty stomach.  Keep yourself hydrated with water, diet soda, or unsweetened iced tea.  Keep in mind that regular soda, juice, and other mixers may contain a lot of sugar and must be counted as carbs. What are tips for following this plan?  Reading food labels  Start by checking the serving size on the "Nutrition Facts" label of packaged foods and drinks. The amount of calories, carbs, fats, and other nutrients listed on the label  is based on one serving of the item. Many items contain more than one serving per package.  Check the total grams (g) of carbs in one serving. You can calculate the number of servings of carbs in one serving by dividing the total carbs by 15. For example, if a food has 30 g of total carbs, it would be equal to 2 servings of carbs.  Check the number of grams (g) of saturated and trans fats in one serving. Choose foods  that have low or no amount of these fats.  Check the number of milligrams (mg) of salt (sodium) in one serving. Most people should limit total sodium intake to less than 2,300 mg per day.  Always check the nutrition information of foods labeled as "low-fat" or "nonfat". These foods may be higher in added sugar or refined carbs and should be avoided.  Talk to your dietitian to identify your daily goals for nutrients listed on the label. Shopping  Avoid buying canned, premade, or processed foods. These foods tend to be high in fat, sodium, and added sugar.  Shop around the outside edge of the grocery store. This includes fresh fruits and vegetables, bulk grains, fresh meats, and fresh dairy. Cooking  Use low-heat cooking methods, such as baking, instead of high-heat cooking methods like deep frying.  Cook using healthy oils, such as olive, canola, or sunflower oil.  Avoid cooking with butter, cream, or high-fat meats. Meal planning  Eat meals and snacks regularly, preferably at the same times every day. Avoid going long periods of time without eating.  Eat foods high in fiber, such as fresh fruits, vegetables, beans, and whole grains. Talk to your dietitian about how many servings of carbs you can eat at each meal.  Eat 4-6 ounces (oz) of lean protein each day, such as lean meat, chicken, fish, eggs, or tofu. One oz of lean protein is equal to: ? 1 oz of meat, chicken, or fish. ? 1 egg. ?  cup of tofu.  Eat some foods each day that contain healthy fats, such as avocado, nuts, seeds, and fish. Lifestyle  Check your blood glucose regularly.  Exercise regularly as told by your health care provider. This may include: ? 150 minutes of moderate-intensity or vigorous-intensity exercise each week. This could be brisk walking, biking, or water aerobics. ? Stretching and doing strength exercises, such as yoga or weightlifting, at least 2 times a week.  Take medicines as told by your  health care provider.  Do not use any products that contain nicotine or tobacco, such as cigarettes and e-cigarettes. If you need help quitting, ask your health care provider.  Work with a Veterinary surgeoncounselor or diabetes educator to identify strategies to manage stress and any emotional and social challenges. Questions to ask a health care provider  Do I need to meet with a diabetes educator?  Do I need to meet with a dietitian?  What number can I call if I have questions?  When are the best times to check my blood glucose? Where to find more information:  American Diabetes Association: diabetes.org  Academy of Nutrition and Dietetics: www.eatright.[employer]  National Institute of Diabetes and Digestive and Kidney Diseases (NIH): CarFlippers.tnwww.niddk.nih.gov Summary  A healthy meal plan will help you control your blood glucose and maintain a healthy lifestyle.  Working with a diet and nutrition specialist (dietitian) can help you make a meal plan that is best for you.  Keep in mind that carbohydrates (carbs) and alcohol have immediate  effects on your blood glucose levels. It is important to count carbs and to use alcohol carefully. This information is not intended to replace advice given to you by your health care provider. Make sure you discuss any questions you have with your health care provider. Document Released: 06/17/2005 Document Revised: 09/02/2017 Document Reviewed: 10/25/2016 Elsevier Patient Education  2020 [place] were cared for by a hospitalist during your hospital stay. If you have any questions about your discharge medications or the care you received while you were in the hospital after you are discharged, you can call the unit and asked to speak with the hospitalist on call if the hospitalist that took care of you is not available. Once you are discharged, your primary care physician will handle any further medical issues.   Please note that NO REFILLS for any discharge  medications will be authorized once you are discharged, as it is imperative that you return to your primary care physician (or establish a relationship with a primary care physician if you do not have one) for your aftercare needs so that they can reassess your need for medications and monitor your lab values.  Please take all your medications with you for your next visit with your Primary MD. Please ask your Primary MD to get all Hospital records sent to his/her office. Please request your Primary MD to go over all hospital test results at the follow up.   If you experience worsening of your admission symptoms, develop shortness of breath, chest pain, suicidal or homicidal thoughts or a life threatening emergency, you must seek medical attention immediately by calling 911 or calling your MD.   Dennis Bast must read the complete instructions/literature along with all the possible adverse reactions/side effects for all the medicines you take including new medications that have been prescribed to you. Take new medicines after you have completely understood and accpet all the possible adverse reactions/side effects.    Do not drive when taking pain medications or sedatives.     Do not take more than prescribed Pain, Sleep and Anxiety Medications   If you have smoked or chewed Tobacco in the last 2 yrs please stop. Stop any regular alcohol  and or recreational drug use.   Wear Seat belts while driving.

## 2019-06-23 NOTE — Progress Notes (Signed)
[facility] Progress Note    Assessment/ Plan:   1.  Severe hypokalemia: unclear etiology at present.  Slowly increasing with aggressive K repletion--> agree with current.  She has 1-2 loose stools daily which I suspect is the cause of her hypokalemia.  Urine K is 10, reflecting renal conservation of K.  Interestingly, Urine Na and Cl are essentially WNL.  This could be due to IVF administration before collection.  Pt also has hypophosphatemia.  TSH and A1c are OK.  Albumin is low.  I don't suspect an RTA since her bicarb on chemistries is slightly alkalotic; Fanconi's syndrome is also ruled out.  Hypokalemic periodic paralysis would be unusual as usually is diagnosed in childhood/ adolescence.  I would expect hypertension with primary hyperaldo.  Bartter's and Gitelman's syndrome have increased urinary K losses (but present with hypoK, met alkalosis, hypomag)--> this is not seen here but I supposed could be confounded with diarrhea.  I will add on urine Ca, Mag, and Phos--> pending.  If nothing pans out there we may have to consider malabsorptive states which are increasing stool losses.  Checking a CK, mildly elevated,  and an SPEP as well as PTH Vit D given hypophosphatemia--> pending.  Renal US to look for nephrocalcinosis-- completed 9/18, none noted  2.  OHS/ OSA: needs CPAP  3.  Tobacco abuse- needs to quit.    4.  Loose stools/ diarrhea: will send C diff and GI panel to ensure no infectious etiology- could be with leukocytosis.  Consider OP GI c/s.    5.  B12 Def: low at 240, will order supp  Subjective:    K improving.  Looks like possible GI losses although other urine lytes are pending.  Feeling much better this AM.  Still having watery loose stools.   Objective:   BP (!) 102/51 (BP Location: Right Arm)   Pulse 81   Temp 98 F (36.7 C) (Oral)   Resp 18   Ht 5' 10"  (1.778 m)   Wt 134.4 kg   SpO2 92%   BMI 42.51 kg/m   Intake/Output Summary (Last 24 hours)  at 06/23/2019 0935 Last data filed at 06/23/2019 5056 Gross per 24 hour  Intake 1320 ml  Output 1350 ml  Net -30 ml   Weight change:   Physical Exam: GEN NAD, sitting in bed HEENT EOMI PERRL NECK no JVD PULM clear bilaterally no c/w/r CV RRR no m/r/g ABD soft, nontender, NABS, obese EXT no LE edema NEURO AAO x 3 nonfocal SKIN warm and dry, no rashes, mult tattoos MSK no effusions  Imaging: US Renal  Result Date: 06/23/2019 CLINICAL DATA:  Acute kidney injury EXAM: RENAL / URINARY TRACT ULTRASOUND COMPLETE COMPARISON:  August 01, 2007 FINDINGS: Right Kidney: Renal measurements: 13.4 x 5.6 x 6.8 cm = volume: 263 mL. There is a 2.8 x 2.4 x 2.7 cm cyst in the lower pole. There is no hydronephrosis. Left Kidney: Renal measurements: 13 x 5.6 x 5.2 cm = volume: 196 mL. Multiple cysts are noted measuring up to approximately 2.5 cm. Bladder: Appears normal for degree of bladder distention. IMPRESSION: No acute sonographic abnormality detected. Electronically Signed   By: Constance Holster M.D.   On: 06/23/2019 00:07   Dg Chest Port 1 View  Result Date: 06/21/2019 CLINICAL DATA:  Leukocytosis EXAM: PORTABLE CHEST 1 VIEW COMPARISON:  01/28/2017 FINDINGS: Cardiomegaly. Both lungs are clear. Chronic fracture deformity of the left clavicle. IMPRESSION: Cardiomegaly without acute abnormality of the lungs in  AP portable projection. Electronically Signed   By: Eddie Candle M.D.   On: 06/21/2019 12:09    Labs: BMET Recent Labs  Lab 06/20/19 1257 06/20/19 1525 06/21/19 0951 06/21/19 1549 06/21/19 2353 06/22/19 0437 06/22/19 1516 06/22/19 2329 06/23/19 0634  NA 137  --  139 140 142 142  --  139 140  K <2.0*  --  <2.0* <2.0* <2.0* 2.0* 2.7* 3.0* 3.3*  CL 101  --  103 103 105 105  --  105 106  CO2 22  --  25 26 27 27   --  27 24  GLUCOSE 164*  --  150* 109* 106* 105*  --  97 122*  BUN 10  --  9 9 9 9   --  10 8  CREATININE 1.26*  --  1.27* 1.12* 1.24* 1.20*  --  1.00 1.04*  CALCIUM 8.6*   --  7.7* 7.8* 7.9* 8.1*  --  8.0* 8.2*  PHOS  --  1.7*  --   --   --   --   --   --  3.0   CBC Recent Labs  Lab 06/20/19 1257 06/21/19 0951 06/22/19 0437  WBC 14.7* 15.5* 12.2*  NEUTROABS  --   --  8.5*  HGB 14.1 13.3 13.0  HCT 44.5 40.8 38.7  MCV 89.9 88.9 88.6  PLT 336 301 261    Medications:    . enoxaparin (LOVENOX) injection  65 mg Subcutaneous Q24H  . multivitamin with minerals  1 tablet Oral Daily  . potassium chloride  40 mEq Oral Q4H  . Ensure Max Protein  11 oz Oral Daily      Madelon Lips, MD [place] pgr [phone] 06/23/2019, 9:35 AM

## 2019-06-23 NOTE — Consult Note (Signed)
Referring Provider:  [place] Primary Care Physician:  Ladell Pier, MD Primary Gastroenterologist: Althia Forts  Reason for Consultation: Severe hypokalemia, evaluate for GI cause  HPI: Elizabeth Campbell is a 38 y.o. female with past medical history of morbid obesity, sleep apnea presented to the hospital with bilateral lower extremity weakness.  Upon initial evaluation, she was found to have a potassium of less than 2.  Was initially seen by neurology and her lower extremity weakness was attributed to severe hypokalemia.  Was subsequently seen by nephrologist with work-up in progress.  She was complaining of loose stools.  GI is consulted for further evaluation.  Patient seen and examined at bedside.  She is feeling much better now.  She was having loose stools with 2-3 bowel movements per day for last 2 weeks which is resolved now.  Only one bowel movement today.  She denies any associated abdominal pain, nausea or vomiting.  Denies any blood in the stool or black stool.  Denies any constipation.  Denies any laxative use.  Paternal uncle was diagnosed with colon cancer, age unknown.  Past Medical History:  Diagnosis Date  . Hydradenitis   . Hypokalemia   . Hypomagnesemia   . Obesity (BMI 30-39.9)   . Obesity hypoventilation syndrome ([place])   . Tobacco use     Past Surgical History:  Procedure Laterality Date  . TONSILLECTOMY      Prior to Admission medications   Medication Sig Start Date End Date Taking? Authorizing Provider  acetaminophen (TYLENOL) 500 MG tablet Take 1,000 mg by mouth every 6 (six) hours as needed for mild pain.   Yes [provider]  Melatonin 10 MG TABS Take 10 mg by mouth at bedtime.   Yes [provider]    Scheduled Meds: . enoxaparin (LOVENOX) injection  65 mg Subcutaneous Q24H  . multivitamin with minerals  1 tablet Oral Daily  . potassium chloride  40 mEq Oral Q4H  . Ensure Max Protein  11 oz Oral Daily  . vitamin B-12  1,000 mcg Oral  Daily   Continuous Infusions: . 0.9 % NaCl with KCl 40 mEq / L 75 mL/hr (06/23/19 1233)   PRN Meds:.acetaminophen **OR** acetaminophen, Melatonin, ondansetron **OR** ondansetron (ZOFRAN) IV, sodium chloride flush  Allergies as of 06/20/2019 - Review Complete 06/20/2019  Allergen Reaction Noted  . Advil [ibuprofen] Hives and Itching 01/27/2017    Family History  Problem Relation Age of Onset  . Hypertension Mother   . Cancer Father     Social History   Socioeconomic History  . Marital status: Married    Spouse name: Not on file  . Number of children: Not on file  . Years of education: Not on file  . Highest education level: Not on file  Occupational History  . Not on file  Social Needs  . Financial resource strain: Not on file  . Food insecurity    Worry: Not on file    Inability: Not on file  . Transportation needs    Medical: Not on file    Non-medical: Not on file  Tobacco Use  . Smoking status: Current Every Day Smoker    Packs/day: 0.25    Years: 15.00    Pack years: 3.75    Types: Cigarettes  . Smokeless tobacco: Never Used  Substance and Sexual Activity  . Alcohol use: No  . Drug use: No  . Sexual activity: Never    Birth control/protection: Surgical  Lifestyle  . Physical activity  Days per week: Not on file    Minutes per session: Not on file  . Stress: Not on file  Relationships  . Social Musician on phone: Not on file    Gets together: Not on file    Attends religious service: Not on file    Active member of club or organization: Not on file    Attends meetings of clubs or organizations: Not on file    Relationship status: Not on file  . Intimate partner violence    Fear of current or ex partner: Not on file    Emotionally abused: Not on file    Physically abused: Not on file    Forced sexual activity: Not on file  Other Topics Concern  . Not on file  Social History Narrative  . Not on file    Review of Systems:Review of  Systems  Constitutional: Negative for chills and fever.  HENT: Negative for hearing loss and tinnitus.   Eyes: Negative for blurred vision and double vision.  Respiratory: Negative for cough and hemoptysis.   Cardiovascular: Negative for chest pain and palpitations.  Gastrointestinal: Positive for diarrhea. Negative for abdominal pain, blood in stool, constipation, heartburn, melena, nausea and vomiting.  Genitourinary: Negative for dysuria and urgency.  Musculoskeletal: Positive for falls, joint pain and myalgias.  Skin: Negative for itching and rash.  Neurological: Positive for weakness. Negative for seizures and loss of consciousness.  Endo/Heme/Allergies: Does not bruise/bleed easily.  Psychiatric/Behavioral: Negative for hallucinations. The patient does not have insomnia.     Physical Exam: Vital signs: Vitals:   06/23/19 0513 06/23/19 1237  BP: (!) 102/51 109/66  Pulse: 81 74  Resp: 18 20  Temp: 98 F (36.7 C) 97.8 F (36.6 C)  SpO2: 92% 97%   Last BM Date: 06/21/19 Physical Exam  Constitutional: She is oriented to person, place, and time. No distress.  Morbidly obese  HENT:  Head: Normocephalic and atraumatic.  Eyes: EOM are normal.  Neck: Normal range of motion. Neck supple.  Cardiovascular: Normal rate and regular rhythm.  Pulmonary/Chest: Effort normal. No respiratory distress.  Anterior exam only  Abdominal: Soft. Bowel sounds are normal. She exhibits no distension. There is no abdominal tenderness. There is no rebound and no guarding.  Obese abdomen  Musculoskeletal: Normal range of motion.        General: No edema.  Neurological: She is alert and oriented to person, place, and time.  Skin: Skin is warm. No erythema.  Psychiatric: She has a normal mood and affect. Thought content normal.    GI:  Lab Results: Recent Labs    06/21/19 0951 06/22/19 0437  WBC 15.5* 12.2*  HGB 13.3 13.0  HCT 40.8 38.7  PLT 301 261   BMET Recent Labs    06/22/19 0437  06/22/19 1516 06/22/19 2329 06/23/19 0634  NA 142  --  139 140  K 2.0* 2.7* 3.0* 3.3*  CL 105  --  105 106  CO2 27  --  27 24  GLUCOSE 105*  --  97 122*  BUN 9  --  10 8  CREATININE 1.20*  --  1.00 1.04*  CALCIUM 8.1*  --  8.0* 8.2*   LFT Recent Labs    06/23/19 0634  PROT 6.9  ALBUMIN 2.5*  AST 42*  ALT 23  ALKPHOS 97  BILITOT 0.6   PT/INR No results for input(s): LABPROT, INR in the last 72 hours.   Studies/Results: US Renal  Result Date: 06/23/2019 CLINICAL DATA:  Acute kidney injury EXAM: RENAL / URINARY TRACT ULTRASOUND COMPLETE COMPARISON:  August 01, 2007 FINDINGS: Right Kidney: Renal measurements: 13.4 x 5.6 x 6.8 cm = volume: 263 mL. There is a 2.8 x 2.4 x 2.7 cm cyst in the lower pole. There is no hydronephrosis. Left Kidney: Renal measurements: 13 x 5.6 x 5.2 cm = volume: 196 mL. Multiple cysts are noted measuring up to approximately 2.5 cm. Bladder: Appears normal for degree of bladder distention. IMPRESSION: No acute sonographic abnormality detected. Electronically Signed   By: Katherine Mantlehristopher  Green M.D.   On: 06/23/2019 00:07    Impression/Plan: -Severe hypokalemia on admission resulting in lower extremity weakness.  Potassium level improving now. -Intermittent loose stools for 2 weeks.  It has resolved now.  Only had one bowel movement today. -Mild elevated AST.  Could be transient.  Her LFTs were normal on admission.  Recommendations ------------------------ - She was having only 2-3 loose stools per day for last 1 to 2 weeks depending on certain foods.  -Her potassium level is improving.Patient denies any diarrhea at this time.  No need for inpatient GI work-up.   -  If recurrent hypokalemia, consider laxative screen.   - Happy to see her back in the office if needed.  Recommend recheck potassium periodically with PCP.  -Discussed with Dr. Butler Denmarkizwan.  GI will sign off.  Call us back if needed    LOS: 2 days   Kathi DerParag Ashleah Valtierra  MD, FACP 06/23/2019, 1:36  PM  Contact #  [phone]

## 2019-06-24 LAB — MAGNESIUM, URINE: Magnesium, urine: 4.5 mg/dL

## 2019-06-24 LAB — PTH, INTACT AND CALCIUM
Calcium, Total (PTH): 8.2 mg/dL — ABNORMAL LOW (ref 8.7–10.2)
PTH: 79 pg/mL — ABNORMAL HIGH (ref 15–65)

## 2019-06-24 LAB — CALCIUM, URINE, RANDOM: Calcium, Ur: 1.7 mg/dL

## 2019-06-25 LAB — PROTEIN ELECTROPHORESIS, SERUM
A/G Ratio: 0.7 (ref 0.7–1.7)
Albumin ELP: 2.7 g/dL — ABNORMAL LOW (ref 2.9–4.4)
Alpha-1-Globulin: 0.2 g/dL (ref 0.0–0.4)
Alpha-2-Globulin: 0.9 g/dL (ref 0.4–1.0)
Beta Globulin: 1 g/dL (ref 0.7–1.3)
Gamma Globulin: 1.5 g/dL (ref 0.4–1.8)
Globulin, Total: 3.7 g/dL (ref 2.2–3.9)
Total Protein ELP: 6.4 g/dL (ref 6.0–8.5)

## 2019-06-25 LAB — FOLATE RBC
Folate, Hemolysate: 339 ng/mL
Folate, RBC: 863 ng/mL (ref >498)
Hematocrit: 39.3 % (ref 34.0–46.6)

## 2019-06-25 LAB — KAPPA/LAMBDA LIGHT CHAINS
Kappa free light chain: 148.4 mg/L — ABNORMAL HIGH (ref 3.3–19.4)
Kappa, lambda light chain ratio: 2.79 — ABNORMAL HIGH (ref 0.26–1.65)
Lambda free light chains: 53.2 mg/L — ABNORMAL HIGH (ref 5.7–26.3)

## 2019-06-25 LAB — VITAMIN D 25 HYDROXY (VIT D DEFICIENCY, FRACTURES): Vit D, 25-Hydroxy: 21.3 ng/mL — ABNORMAL LOW (ref 30.0–100.0)

## 2019-06-27 ENCOUNTER — Telehealth: Payer: Self-pay

## 2019-06-27 DIAGNOSIS — E876 Hypokalemia: Secondary | ICD-10-CM

## 2019-06-27 NOTE — Telephone Encounter (Signed)
Call placed to patient to follow up from hospitalization.  She stated that she is doing " okay."  Has medications, no questions about discharge instructions.  Hospital follow up appointment scheduled for 07/10/2019 @ 1450 at [facility] with Dr Wynetta Emery. There are no appointments available in next 2 weeks for patient at [facility]. She said that she knows where [facility] is and has transportation.  She wanted to know if she needs to have labs done prior to her appointment. Informed her that Dr Wynetta Emery would be notified of her question.

## 2019-07-02 NOTE — Telephone Encounter (Signed)
Attempted to contact the patient # [phone] to inform her that Dr Wynetta Emery would like her to have her labwork done this week. Message left with call back requested to this CM # [phone]

## 2019-07-03 NOTE — Telephone Encounter (Signed)
Attempted again to contact patient # [phone] to inform her that Dr Wynetta Emery would like her to have labs done this week. Message left with call back requested to this CM # [phone]

## 2019-07-04 NOTE — Telephone Encounter (Signed)
Attempt made to contact patient # [phone] to inform her of need for labs.message left with call back to this CM requested.   Call placed to patient's mother, Donita - DPR on file to speak to [facility].  Informed her of need for labs this week. She can come to [facility] lab. She said she would notify patient.   She also verbalized that she was quite upset that the patient was not called to have  labwork done last week.

## 2019-07-05 ENCOUNTER — Ambulatory Visit: Payer: Self-pay | Attending: Internal Medicine

## 2019-07-05 ENCOUNTER — Other Ambulatory Visit: Payer: Self-pay

## 2019-07-05 DIAGNOSIS — E876 Hypokalemia: Secondary | ICD-10-CM

## 2019-07-06 ENCOUNTER — Telehealth: Payer: Self-pay | Admitting: Internal Medicine

## 2019-07-06 ENCOUNTER — Other Ambulatory Visit: Payer: Self-pay | Admitting: Internal Medicine

## 2019-07-06 LAB — BASIC METABOLIC PANEL
BUN/Creatinine Ratio: 10 (ref 9–23)
BUN: 12 mg/dL (ref 6–20)
CO2: 25 mmol/L (ref 20–29)
Calcium: 9.2 mg/dL (ref 8.7–10.2)
Chloride: 96 mmol/L (ref 96–106)
Creatinine, Ser: 1.21 mg/dL — ABNORMAL HIGH (ref 0.57–1.00)
GFR calc Af Amer: 66 mL/min/{1.73_m2} (ref >59)
GFR calc non Af Amer: 57 mL/min/{1.73_m2} — ABNORMAL LOW (ref >59)
Glucose: 235 mg/dL — ABNORMAL HIGH (ref 65–99)
Potassium: 3.4 mmol/L — ABNORMAL LOW (ref 3.5–5.2)
Sodium: 137 mmol/L (ref 134–144)

## 2019-07-06 LAB — MAGNESIUM: Magnesium: 2.1 mg/dL (ref 1.6–2.3)

## 2019-07-06 MED ORDER — POTASSIUM CHLORIDE ER 8 MEQ PO TBCR: 8.0000 meq | EXTENDED_RELEASE_TABLET | Freq: Every day | ORAL | 2 refills | Status: DC

## 2019-07-06 NOTE — Telephone Encounter (Signed)
Called patient and informed that the results had not been released by the PCP. I would not be able to provide her with the lab results. Informed that there was not anything critical but results would be delivered until Monday.

## 2019-07-06 NOTE — Telephone Encounter (Signed)
Patient was contacted today with results that were just completed 1 day prior

## 2019-07-06 NOTE — Telephone Encounter (Signed)
-----   Message from Ladell Pier, MD sent at 07/06/2019  5:14 PM EDT ----- Magnesium level is nl.  Potassium level slightly low but better than at dischg.  Recommend potassium supplement 8 meq once a day.  Prescription sent to [facility] pharmacy.  Can also eat Potassium rich foods like bananas.  Blood sugar is elevated at 235.Will need to be screen for DM on upcoming appointment.

## 2019-07-06 NOTE — Telephone Encounter (Signed)
Patient verified DOB Patient is aware of lab results and screening for DM on her Tuesday appt. Patient will pick up supplement on Monday.

## 2019-07-06 NOTE — Telephone Encounter (Signed)
Patients mother called for the patients lab results states she needs the results today. States the patients potassium level were low that she could barely walk. Please follow up.

## 2019-07-09 MED FILL — POTASSIUM CHLORIDE ER 8 MEQ: 8 | 30 days supply | Qty: 30 | Fill #0

## 2019-07-10 ENCOUNTER — Encounter: Payer: Self-pay | Admitting: Internal Medicine

## 2019-07-10 ENCOUNTER — Ambulatory Visit (INDEPENDENT_AMBULATORY_CARE_PROVIDER_SITE_OTHER): Payer: Self-pay | Admitting: Internal Medicine

## 2019-07-10 DIAGNOSIS — E559 Vitamin D deficiency, unspecified: Secondary | ICD-10-CM | POA: Insufficient documentation

## 2019-07-10 DIAGNOSIS — E876 Hypokalemia: Secondary | ICD-10-CM

## 2019-07-10 DIAGNOSIS — E538 Deficiency of other specified B group vitamins: Secondary | ICD-10-CM

## 2019-07-10 DIAGNOSIS — R768 Other specified abnormal immunological findings in serum: Secondary | ICD-10-CM

## 2019-07-10 DIAGNOSIS — E213 Hyperparathyroidism, unspecified: Secondary | ICD-10-CM

## 2019-07-10 DIAGNOSIS — Z6841 Body Mass Index (BMI) 40.0 and over, adult: Secondary | ICD-10-CM

## 2019-07-10 DIAGNOSIS — R7303 Prediabetes: Secondary | ICD-10-CM

## 2019-07-10 MED ORDER — POTASSIUM CHLORIDE ER 8 MEQ PO TBCR: 8.0000 meq | EXTENDED_RELEASE_TABLET | Freq: Every day | ORAL | 2 refills | Status: DC

## 2019-07-10 MED ORDER — METFORMIN HCL 500 MG PO TABS: 500.0000 mg | ORAL_TABLET | Freq: Every day | ORAL | 3 refills | Status: DC

## 2019-07-10 NOTE — Progress Notes (Signed)
Virtual Visit via Telephone Note Patient was offered in person visit and in person visit was preferred.  However I was told by my medical assistant that patient's mother wanted him to be telephone visit.  However once I spoke with patient, patient tells me that she actually wanted an in person visit but was told by my office that it would be a telephone visit  I connected with Elizabeth Campbell on 07/10/19 at 3:44 p.m by telephone and verified that I am speaking with the correct person using two identifiers. I am in my office.  The patient is at home.  Only the patient and myself participated in this encounter.  I discussed the limitations, risks, security and privacy concerns of performing an evaluation and management service by telephone and the availability of in person appointments. I also discussed with the patient that there may be a patient responsible charge related to this service. The patient expressed understanding and agreed to proceed.   History of Present Illness: History of tobacco dependence, pulHTN,  probable OSA/OHS, obesity, vitamin D deficiency,  Purpose of today's visit is hospital follow-up.  Patient was last seen by me in 2018. Patient hospitalized 916-19/2020 with lower extremity weakness.  Found to have severe hypokalemia requiring aggressive replacement.  She was seen by nephrology.  Their assessment was that her low potassium was likely due to GI losses as she was having some diarrhea.  She had mild renal insufficiency with creatinine improved to 1.04 at the time of discharge.  Work-up included vitamin D level that was low at 21.3, elevated PTH at 79 with calcium level of 8.2, low normal vitamin B12 level of 240, protein electrophoresis normal but abnormal kappa/lambda light chain and light chain ratio.  Does have prediabetes with hemoglobin A1c of 6.  Today patient reports she is doing better.  Weakness in the legs has resolved. Recent repeat potassium level was 3.4.  I had  called in a prescription for potassium supplement for her but she did not pick it up.  She states she needs it to be sent to a different pharmacy.  Blood sugar on recent chemistry was 235.  Patient states she is just trying to Dr. peppers prior to having the blood drawn.  She denies any polydipsia but endorses polyuria.  No blurred vision.  She admits that she is not very active.  She feels she can do better with her eating habits.  In the past there was a question of possible OSA but patient never had sleep study done.  Patient states that since I saw her back in 2018 she is no longer on oxygen.  She denies loud snoring or morning headaches.  She feels refreshed when she awakes from sleep.   Observations/Objective: No direct observation done as this was a telephone encounter    Chemistry      Component Value Date/Time   NA 137 07/05/2019 0858   K 3.4 (L) 07/05/2019 0858   CL 96 07/05/2019 0858   CO2 25 07/05/2019 0858   BUN 12 07/05/2019 0858   CREATININE 1.21 (H) 07/05/2019 0858      Component Value Date/Time   CALCIUM 9.2 07/05/2019 0858   CALCIUM 8.2 (L) 06/23/2019 0634   ALKPHOS 97 06/23/2019 0634   AST 42 (H) 06/23/2019 0634   ALT 23 06/23/2019 0634   BILITOT 0.6 06/23/2019 0634       Assessment and Plan: 1. Hypokalemia Patient will pick up potassium supplement and start taking.  After  she has been on the medication for 1 week, she will return to the lab for repeat study. - potassium chloride (KLOR-CON) 8 MEQ tablet; Take 1 tablet (8 mEq total) by mouth daily.  Dispense: 30 tablet; Refill: 2 - Basic Metabolic Panel; Future  2. Prediabetes Dietary counseling given.  Encourage patient to eliminate sugary drinks from her diet, cut back on white carbohydrates and try to incorporate fresh fruits and vegetables into the diet. Encouraged her to get in some form of moderate intensity exercise at least 3 to 4 days a week for 30 minutes. Start low-dose metformin - metFORMIN  (GLUCOPHAGE) 500 MG tablet; Take 1 tablet (500 mg total) by mouth daily with breakfast.  Dispense: 30 tablet; Refill: 3  3. Class 3 severe obesity due to excess calories with serious comorbidity and body mass index (BMI) of 40.0 to 44.9 in adult [facility]) See #2 above  4. Elevated serum immunoglobulin free light chains Will refer to hematology for evaluation of this.  - Ambulatory referral to Hematology  5. Hyperparathyroidism (HCC) We will repeat parathyroid hormone level - PTH, Intact and Calcium; Future  6. Vitamin D deficiency Advised to purchase vitamin D 400 IUs over-the-counter and take 1 daily  7. B12 deficiency Continue B12 supplement   Follow Up Instructions: 6-8 wks   I discussed the assessment and treatment plan with the patient. The patient was provided an opportunity to ask questions and all were answered. The patient agreed with the plan and demonstrated an understanding of the instructions.   The patient was advised to call back or seek an in-person evaluation if the symptoms worsen or if the condition fails to improve as anticipated.  I provided 16 minutes of non-face-to-face time during this encounter.   Jonah Blue, MD

## 2019-07-10 NOTE — Progress Notes (Signed)
Would like recent lab results.  Wants Potassium supplement sent to [facility] in [place]. Never picked it up.  States that lower extremity weakness has resolved.

## 2019-07-13 ENCOUNTER — Telehealth: Payer: Self-pay | Admitting: Hematology

## 2019-07-13 NOTE — Telephone Encounter (Signed)
Received a new hem referral from Dr. Wynetta Emery for abnl spep. I cld and spoke to Ms. Philips to see Dr. Irene Limbo on 11/9 at 10am. Per pt she needed her appt after 11/1 bc that's when her insurance is active. She's been made are to arrive 14 minutes early.

## 2019-07-19 ENCOUNTER — Telehealth: Payer: Self-pay | Admitting: Internal Medicine

## 2019-07-19 NOTE — Telephone Encounter (Signed)
Please call her pharmacy something is wrong  potassium chloride (KLOR-CON) 8 MEQ tablet

## 2019-07-19 NOTE — Telephone Encounter (Signed)
This was addressed

## 2019-08-13 ENCOUNTER — Other Ambulatory Visit: Payer: Self-pay

## 2019-08-13 ENCOUNTER — Inpatient Hospital Stay: Payer: BC Managed Care – PPO | Attending: Hematology | Admitting: Hematology

## 2019-08-13 ENCOUNTER — Inpatient Hospital Stay: Payer: BC Managed Care – PPO

## 2019-08-13 ENCOUNTER — Telehealth: Payer: Self-pay | Admitting: Hematology

## 2019-08-13 VITALS — BP 123/78 | HR 88 | Temp 98.3°F | Resp 18 | Ht 70.0 in | Wt 279.9 lb

## 2019-08-13 DIAGNOSIS — R768 Other specified abnormal immunological findings in serum: Secondary | ICD-10-CM

## 2019-08-13 DIAGNOSIS — Z809 Family history of malignant neoplasm, unspecified: Secondary | ICD-10-CM | POA: Insufficient documentation

## 2019-08-13 DIAGNOSIS — Z7984 Long term (current) use of oral hypoglycemic drugs: Secondary | ICD-10-CM | POA: Diagnosis not present

## 2019-08-13 DIAGNOSIS — E876 Hypokalemia: Secondary | ICD-10-CM | POA: Insufficient documentation

## 2019-08-13 DIAGNOSIS — R779 Abnormality of plasma protein, unspecified: Secondary | ICD-10-CM | POA: Diagnosis present

## 2019-08-13 DIAGNOSIS — L732 Hidradenitis suppurativa: Secondary | ICD-10-CM | POA: Diagnosis not present

## 2019-08-13 DIAGNOSIS — F17210 Nicotine dependence, cigarettes, uncomplicated: Secondary | ICD-10-CM | POA: Insufficient documentation

## 2019-08-13 DIAGNOSIS — Z79899 Other long term (current) drug therapy: Secondary | ICD-10-CM | POA: Insufficient documentation

## 2019-08-13 DIAGNOSIS — E662 Morbid (severe) obesity with alveolar hypoventilation: Secondary | ICD-10-CM | POA: Insufficient documentation

## 2019-08-13 LAB — CMP (CANCER CENTER ONLY)
ALT: 22 U/L (ref 0–44)
AST: 21 U/L (ref 15–41)
Albumin: 3.2 g/dL — ABNORMAL LOW (ref 3.5–5.0)
Alkaline Phosphatase: 107 U/L (ref 38–126)
Anion gap: 12 (ref 5–15)
BUN: 11 mg/dL (ref 6–20)
CO2: 29 mmol/L (ref 22–32)
Calcium: 9.2 mg/dL (ref 8.9–10.3)
Chloride: 101 mmol/L (ref 98–111)
Creatinine: 1.17 mg/dL — ABNORMAL HIGH (ref 0.44–1.00)
GFR, Est AFR Am: >60 mL/min (ref >60)
GFR, Estimated: 59 mL/min — ABNORMAL LOW (ref >60)
Glucose, Bld: 91 mg/dL (ref 70–99)
Potassium: 3 mmol/L — CL (ref 3.5–5.1)
Sodium: 142 mmol/L (ref 135–145)
Total Bilirubin: 0.3 mg/dL (ref 0.3–1.2)
Total Protein: 7.9 g/dL (ref 6.5–8.1)

## 2019-08-13 LAB — CBC WITH DIFFERENTIAL/PLATELET
Abs Immature Granulocytes: 0.12 10*3/uL — ABNORMAL HIGH (ref 0.00–0.07)
Basophils Absolute: 0.1 10*3/uL (ref 0.0–0.1)
Basophils Relative: 0 %
Eosinophils Absolute: 0.4 10*3/uL (ref 0.0–0.5)
Eosinophils Relative: 3 %
HCT: 43.3 % (ref 36.0–46.0)
Hemoglobin: 14.1 g/dL (ref 12.0–15.0)
Immature Granulocytes: 1 %
Lymphocytes Relative: 22 %
Lymphs Abs: 3.1 10*3/uL (ref 0.7–4.0)
MCH: 30.5 pg (ref 26.0–34.0)
MCHC: 32.6 g/dL (ref 30.0–36.0)
MCV: 93.5 fL (ref 80.0–100.0)
Monocytes Absolute: 0.5 10*3/uL (ref 0.1–1.0)
Monocytes Relative: 3 %
Neutro Abs: 10 10*3/uL — ABNORMAL HIGH (ref 1.7–7.7)
Neutrophils Relative %: 71 %
Platelets: 272 10*3/uL (ref 150–400)
RBC: 4.63 MIL/uL (ref 3.87–5.11)
RDW: 15.7 % — ABNORMAL HIGH (ref 11.5–15.5)
WBC: 14.1 10*3/uL — ABNORMAL HIGH (ref 4.0–10.5)
nRBC: 0 % (ref 0.0–0.2)

## 2019-08-13 MED ORDER — POTASSIUM CHLORIDE CRYS ER 20 MEQ PO TBCR: 20.0000 meq | EXTENDED_RELEASE_TABLET | Freq: Two times a day (BID) | ORAL | 0 refills | Status: DC

## 2019-08-13 NOTE — Progress Notes (Signed)
HEMATOLOGY/ONCOLOGY CONSULTATION NOTE  Date of Service: 08/13/2019  Patient Care Team: Marcine MatarJohnson, Deborah B, MD as PCP - General (Internal Medicine)  CHIEF COMPLAINTS/PURPOSE OF CONSULTATION:  Abnormal SPEP   HISTORY OF PRESENTING ILLNESS:   Elizabeth Campbell is a wonderful 38 y.o. female who has been referred to us by Dr [name] for evaluation and management of abnormal SPEP. The pt reports that she is doing well overall.   The pt reports that her potassium levels plummeted without cause and she began experiencing lower extremity weakness. She couldn't walk and had trouble moving. She went to the ED on 09/16. Pt states that they gave her potassium and she felt better immediately after. She is no longer experiencing diarrhea, has not been taking any laxatives and has no known food intolerances. She was never told what was causing her diarrhea. The workup that was done in September was a part of a kidney function workup. Pt has not seen a Nephrologist since being discharged from the hospital.   Pt is not longer taking Metformin. She is also no longer using her CPAP machine that was given to her by her friend. They placed her on a CPAP machine during a previous hospital visit but she has never had a sleep study. Pt is taking Potassium, Magnesium and Vitamin B12 as well as a multivitamin. She is still smoking about a 1/2 pack of cigarettes per day. Pt reports that she has been drinking an adequate amount of water since hospital discharge.   Most recent lab results (06/23/2019) of CBC w/diff and CMP is as follows: all values are WNL except for WBC at 12.2K, RDW at 17.0, Neutro Abs at 8.5, Abs Immature Granulocytes at 0.09K, Potassium at 3.3, Glucose at 122, Creatinine at 1.04, Calcium at 8.2, Albumin at 2.5, AST at 42. 06/23/2019 Total CK at 705 06/23/2019 Magnesium at 2.1 06/23/2019 Phosphorus at 3.0 06/23/2019 Vitamin B12 at 240 06/23/2019 Procalcitonin at <0.10 06/23/2019 Vitamin D  25-Hydroxy at 21.3 06/23/2019 PTH at 79, Total Calcium at 8.2 06/23/2019 Protein electrophoresis is WNL except for Albumin at 2.7 06/23/2019 Folate at RBC is as follows: Folate Hemolysate at 339.0, HCT at 39.3, Folate RBC at 863 06/23/2019 Kappa free light chain at 148.4, Lamda free light chains at 53.2, K/L light chain ratio at 2.79   On review of systems, pt denies diarrhea, lower extremity weakness, abdominal pain, bowel movement issues and any other symptoms.   On Social Hx the pt reports she smokes 1/2 ppd of cigarettes   MEDICAL HISTORY:  Past Medical History:  Diagnosis Date  . Hydradenitis   . Hypokalemia   . Hypomagnesemia   . Obesity (BMI 30-39.9)   . Obesity hypoventilation syndrome (HCC)   . Tobacco use     SURGICAL HISTORY: Past Surgical History:  Procedure Laterality Date  . TONSILLECTOMY      SOCIAL HISTORY: Social History   Socioeconomic History  . Marital status: Married    Spouse name: Not on file  . Number of children: Not on file  . Years of education: Not on file  . Highest education level: Not on file  Occupational History  . Not on file  Social Needs  . Financial resource strain: Not on file  . Food insecurity    Worry: Not on file    Inability: Not on file  . Transportation needs    Medical: Not on file    Non-medical: Not on file  Tobacco Use  . Smoking  status: Current Every Day Smoker    Packs/day: 0.25    Years: 15.00    Pack years: 3.75    Types: Cigarettes  . Smokeless tobacco: Never Used  Substance and Sexual Activity  . Alcohol use: No  . Drug use: No  . Sexual activity: Never    Birth control/protection: Surgical  Lifestyle  . Physical activity    Days per week: Not on file    Minutes per session: Not on file  . Stress: Not on file  Relationships  . Social Musician on phone: Not on file    Gets together: Not on file    Attends religious service: Not on file    Active member of club or organization: Not  on file    Attends meetings of clubs or organizations: Not on file    Relationship status: Not on file  . Intimate partner violence    Fear of current or ex partner: Not on file    Emotionally abused: Not on file    Physically abused: Not on file    Forced sexual activity: Not on file  Other Topics Concern  . Not on file  Social History Narrative  . Not on file    FAMILY HISTORY: Family History  Problem Relation Age of Onset  . Hypertension Mother   . Cancer Father     ALLERGIES:  is allergic to advil [ibuprofen].  MEDICATIONS:  Current Outpatient Medications  Medication Sig Dispense Refill  . acetaminophen (TYLENOL) 500 MG tablet Take 1,000 mg by mouth every 6 (six) hours as needed for mild pain.    . Magnesium 250 MG TABS Take 250 mg by mouth daily.    . Melatonin 10 MG TABS Take 10 mg by mouth at bedtime.    . metFORMIN (GLUCOPHAGE) 500 MG tablet Take 1 tablet (500 mg total) by mouth daily with breakfast. 30 tablet 3  . potassium chloride (KLOR-CON) 8 MEQ tablet Take 1 tablet (8 mEq total) by mouth daily. 30 tablet 2  . potassium chloride SA (KLOR-CON) 20 MEQ tablet Take 1 tablet (20 mEq total) by mouth 2 (two) times daily. 60 tablet 0  . vitamin B-12 1000 MCG tablet Take 1 tablet (1,000 mcg total) by mouth daily. 30 tablet 0   No current facility-administered medications for this visit.     REVIEW OF SYSTEMS:    10 Point review of Systems was done is negative except as noted above.  PHYSICAL EXAMINATION: ECOG PERFORMANCE STATUS: 1 - Symptomatic but completely ambulatory  . Vitals:   08/13/19 1324  BP: 123/78  Pulse: 88  Resp: 18  Temp: 98.3 F (36.8 C)  SpO2: 95%   Filed Weights   08/13/19 1324  Weight: 279 lb 14.4 oz (127 kg)   .Body mass index is 40.16 kg/m.  GENERAL:alert, in no acute distress and comfortable SKIN: no acute rashes, no significant lesions EYES: conjunctiva are pink and non-injected, sclera anicteric OROPHARYNX: MMM, no exudates,  no oropharyngeal erythema or ulceration NECK: supple, no JVD LYMPH:  no palpable lymphadenopathy in the cervical, axillary or inguinal regions LUNGS: clear to auscultation b/l with normal respiratory effort HEART: regular rate & rhythm ABDOMEN:  normoactive bowel sounds , non tender, not distended. Extremity: no pedal edema PSYCH: alert & oriented x 3 with fluent speech NEURO: no focal motor/sensory deficits  LABORATORY DATA:  I have reviewed the data as listed  . CBC Latest Ref Rng & Units 08/13/2019 06/23/2019 06/22/2019  WBC 4.0 - 10.5 K/uL 14.1(H) - 12.2(H)  Hemoglobin 12.0 - 15.0 g/dL 14.1 - 13.0  Hematocrit 36.0 - 46.0 % 43.3 39.3 38.7  Platelets 150 - 400 K/uL 272 - 261    . CMP Latest Ref Rng & Units 08/13/2019 07/05/2019 06/23/2019  Glucose 70 - 99 mg/dL 91 235(H) 122(H)  BUN 6 - 20 mg/dL 11 12 8   Creatinine 0.44 - 1.00 mg/dL 1.17(H) 1.21(H) 1.04(H)  Sodium 135 - 145 mmol/L 142 137 140  Potassium 3.5 - 5.1 mmol/L 3.0(LL) 3.4(L) 3.3(L)  Chloride 98 - 111 mmol/L 101 96 106  CO2 22 - 32 mmol/L 29 25 24   Calcium 8.9 - 10.3 mg/dL 9.2 9.2 8.2(L)  Total Protein 6.5 - 8.1 g/dL 7.9 - 6.9  Total Bilirubin 0.3 - 1.2 mg/dL 0.3 - 0.6  Alkaline Phos 38 - 126 U/L 107 - 97  AST 15 - 41 U/L 21 - 42(H)  ALT 0 - 44 U/L 22 - 23     RADIOGRAPHIC STUDIES: I have personally reviewed the radiological images as listed and agreed with the findings in the report. No results found.  ASSESSMENT & PLAN:   1) Elevated/Abnormal light chains PLAN: -Discussed patient's most recent labs from 06/23/2019, all values are WNL except for WBC at 12.2K, RDW at 17.0, Neutro Abs at 8.5, Abs Immature Granulocytes at 0.09K, Potassium at 3.3, Glucose at 122, Creatinine at 1.04, Calcium at 8.2, Albumin at 2.5, AST at 42. 06/23/2019 Total CK at 705 06/23/2019 Magnesium at 2.1 06/23/2019 Phosphorus at 3.0 06/23/2019 Vitamin B12 at 240 06/23/2019 Procalcitonin at <0.10 06/23/2019 Vitamin D 25-Hydroxy at 21.3  06/23/2019 PTH at 79, Total Calcium at 8.2 06/23/2019 Protein electrophoresis is WNL except for Albumin at 2.7 06/23/2019 Folate at RBC is as follows: Folate Hemolysate at 339.0, HCT at 39.3, Folate RBC at 863 06/23/2019 Kappa free light chain at 148.4, Lamda free light chains at 53.2, K/L light chain ratio at 2.79 -Pt does not appear to have a plasma cell disorder based on clinical and laboratory evidence -Recommended smoking cessation  -Recommend pt f/u with PCP for sleep apnea study  -Will get labs today -Will see back as needed based on labs   FOLLOW UP: Labs today RTC with Dr Irene Limbo as needed based on lab results  All of the patients questions were answered with apparent satisfaction. The patient knows to call the clinic with any problems, questions or concerns.  I spent 25 mins counseling the patient face to face. The total time spent in the appointment was 35 minutes and more than 50% was on counseling and direct patient cares.    Sullivan Lone MD [place] AAHIVMS [facility] Hematology/Oncology Physician [facility]  (Office):       [phone] (Work cell):  [phone] (Fax):           [phone]  08/13/2019 5:05 PM  I, Yevette Edwards, am acting as a scribe for Dr. Sullivan Lone.   .I have reviewed the above documentation for accuracy and completeness, and I agree with the above. Brunetta Genera MD

## 2019-08-13 NOTE — Progress Notes (Signed)
Lab called potassium level of 3.0. Dr. Irene Limbo made aware and patient instructed by Dr. Irene Limbo to take potassium 35mEq by mouth twice daily. Patient also instructed to have follow up labs drawn by PCP in 2 weeks. Patient verbalized understanding.

## 2019-08-13 NOTE — Telephone Encounter (Signed)
Scheduled per 11/09 los, checked patient in for labs.

## 2019-08-14 LAB — KAPPA/LAMBDA LIGHT CHAINS
Kappa free light chain: 139.5 mg/L — ABNORMAL HIGH (ref 3.3–19.4)
Kappa, lambda light chain ratio: 2.56 — ABNORMAL HIGH (ref 0.26–1.65)
Lambda free light chains: 54.5 mg/L — ABNORMAL HIGH (ref 5.7–26.3)

## 2019-09-06 ENCOUNTER — Telehealth: Payer: Self-pay

## 2019-09-06 ENCOUNTER — Encounter: Payer: Self-pay | Admitting: Internal Medicine

## 2019-09-06 ENCOUNTER — Other Ambulatory Visit: Payer: Self-pay

## 2019-09-06 ENCOUNTER — Ambulatory Visit: Payer: BC Managed Care – PPO | Attending: Internal Medicine | Admitting: Internal Medicine

## 2019-09-06 DIAGNOSIS — R7303 Prediabetes: Secondary | ICD-10-CM | POA: Diagnosis not present

## 2019-09-06 DIAGNOSIS — L02411 Cutaneous abscess of right axilla: Secondary | ICD-10-CM | POA: Diagnosis not present

## 2019-09-06 DIAGNOSIS — L02214 Cutaneous abscess of groin: Secondary | ICD-10-CM

## 2019-09-06 DIAGNOSIS — L02412 Cutaneous abscess of left axilla: Secondary | ICD-10-CM

## 2019-09-06 DIAGNOSIS — E876 Hypokalemia: Secondary | ICD-10-CM | POA: Diagnosis not present

## 2019-09-06 DIAGNOSIS — E349 Endocrine disorder, unspecified: Secondary | ICD-10-CM

## 2019-09-06 DIAGNOSIS — R7989 Other specified abnormal findings of blood chemistry: Secondary | ICD-10-CM

## 2019-09-06 MED ORDER — SULFAMETHOXAZOLE-TRIMETHOPRIM 400-80 MG PO TABS: 1.0000 | ORAL_TABLET | Freq: Two times a day (BID) | ORAL | 0 refills | Status: DC

## 2019-09-06 NOTE — Progress Notes (Signed)
Virtual Visit via Telephone Note Due to current restrictions/limitations of in-office visits due to the COVID-19 pandemic, this scheduled clinical appointment was converted to a telehealth visit  I connected with Elizabeth Campbell on 09/06/19 at 4:55 p.m by telephone and verified that I am speaking with the correct person using two identifiers. I am in my office.  The patient is at home.  Only the patient and myself participated in this encounter.   I discussed the limitations, risks, security and privacy concerns of performing an evaluation and management service by telephone and the availability of in person appointments. I also discussed with the patient that there may be a patient responsible charge related to this service. The patient expressed understanding and agreed to proceed.   History of Present Illness: History of tobacco dependence, pulHTN,  probable OSA/OHS, obesity, vitamin D deficiency, hypokalemia, hyperparathyroidism.  Last eval 07/10/2019.  Saw Dr. Candise Che since last visit with me to evaluate the elevated serum immunoglobulin free light chains.  She was assessed not to have any abnormality of concern.  When I last spoke with her, she was supposed to return to the lab to have potassium level rechecked and parathyroid hormone level rechecked but did not return to the laboratory.  On blood test that was done last month when she saw the oncologist, she was found to have low potassium level of 3.3.  Dr. Ronaldo Miyamoto increase her potassium to 20 mg twice a day.  Vit D Def:  Taking MV tab daily that has 1000 IU of Vit D  B12 def:  Taking supplement daily  PreDM:  Took Metformin 500 mg for 3 days and stopped because it dropped BS too low into the 50s.  Checks BS 5 x a wk at work.  BS have been around 100.  Started Keto diet x 1.5 wks.  Work in [place] and does a lot of walking.   Has painful small abscess in both axilla and around panty line x 2 wks. requests antibiotics.  She gives history of  recurrence of these intermittently.  Last addressed by me in 2018.  There was a question at that time of hidradenitis. Observations/Objective:  Lab Results  Component Value Date   HGBA1C 6.0 (H) 06/20/2019    Assessment and Plan: 1. Prediabetes She will stay off Metformin for now since she reports low blood sugar episodes with it and that her blood sugars have been good.  Encourage healthy eating habits.  Encouraged her to stay active.  Plan to recheck A1c on her next visit  2. Hypokalemia Currently on potassium 20 mEq twice a day.  Request that she return to the lab next week to have chemistry done.  I will also check aldosterone and renin levels - Basic Metabolic Panel - Aldosterone + renin activity w/ ratio  3. Abscesses of both axillae -Advised to be seen in the ER for incision and drainage if they do not respond to the antibiotics that I will prescribe - sulfamethoxazole-trimethoprim (BACTRIM) 400-80 MG tablet; Take 1 tablet by mouth 2 (two) times daily.  Dispense: 14 tablet; Refill: 0  4. Abscess, groin - sulfamethoxazole-trimethoprim (BACTRIM) 400-80 MG tablet; Take 1 tablet by mouth 2 (two) times daily.  Dispense: 14 tablet; Refill: 0  5. Elevated parathyroid hormone - Parathyroid hormone, intact (no Ca)   Follow Up Instructions: 3 months   I discussed the assessment and treatment plan with the patient. The patient was provided an opportunity to ask questions and all were answered. The  patient agreed with the plan and demonstrated an understanding of the instructions.   The patient was advised to call back or seek an in-person evaluation if the symptoms worsen or if the condition fails to improve as anticipated.  I provided 10 minutes of non-face-to-face time during this encounter.   Karle Plumber, MD

## 2019-09-06 NOTE — Telephone Encounter (Signed)
Contacted pt to schedule 4 month f/u pt didn't answer lvm asking pt to give a call back to schedule  

## 2019-12-25 IMAGING — US US RENAL
1 series · 14 of 25 positions shown · non-contrast
Comparison: August 01, 2007

CLINICAL DATA: Acute kidney injury

EXAM:
RENAL / URINARY TRACT ULTRASOUND COMPLETE

[Series 1: us renal · 14 of 43 slices shown]
[im 1/43]
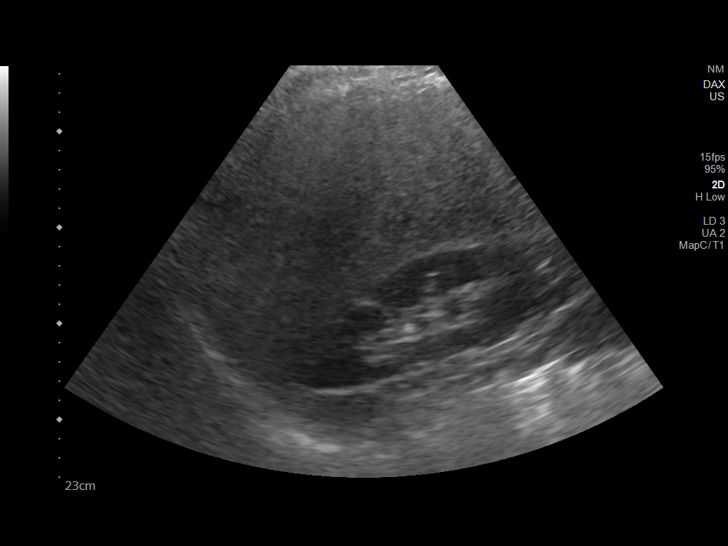
[im 4/43]
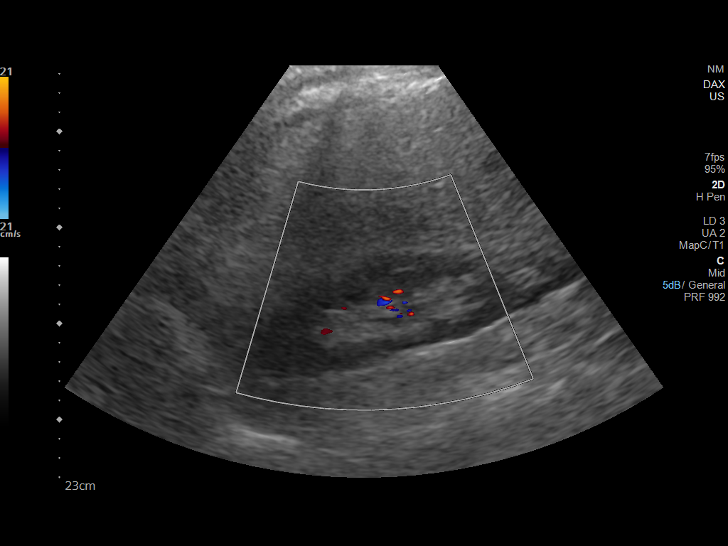
[im 8/43]
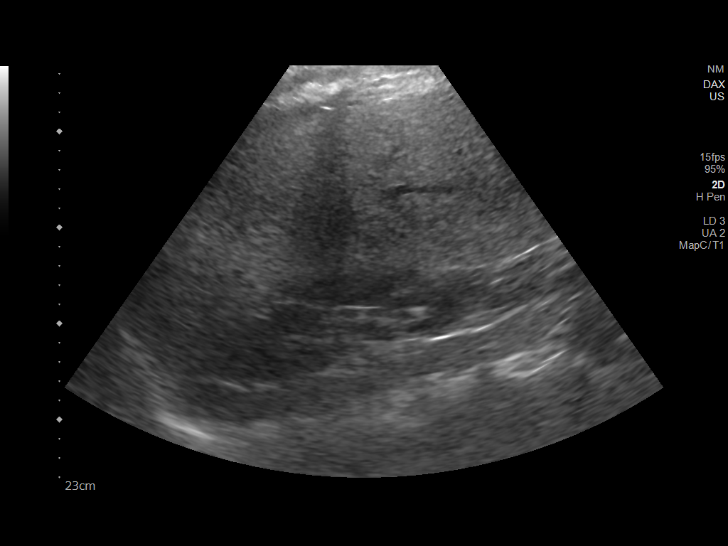
[im 11/43]
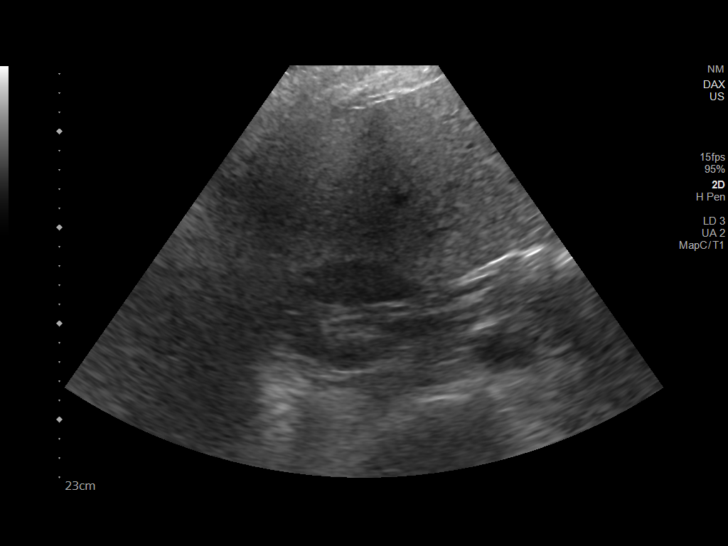
[im 15/43]
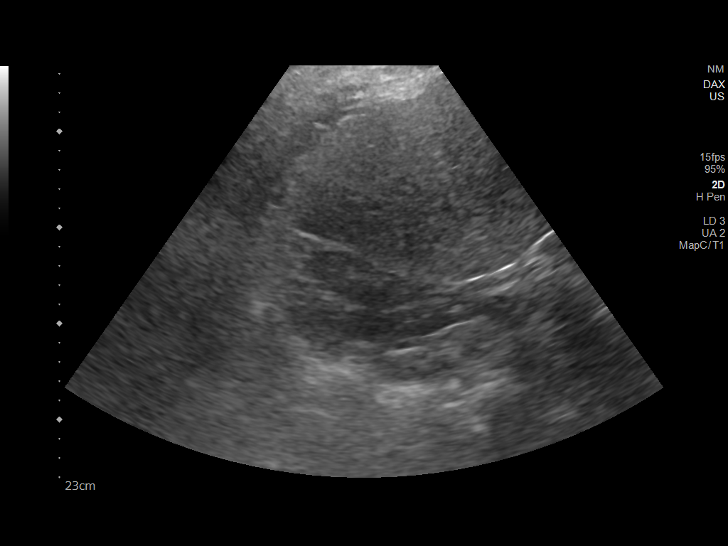
[im 16/43]
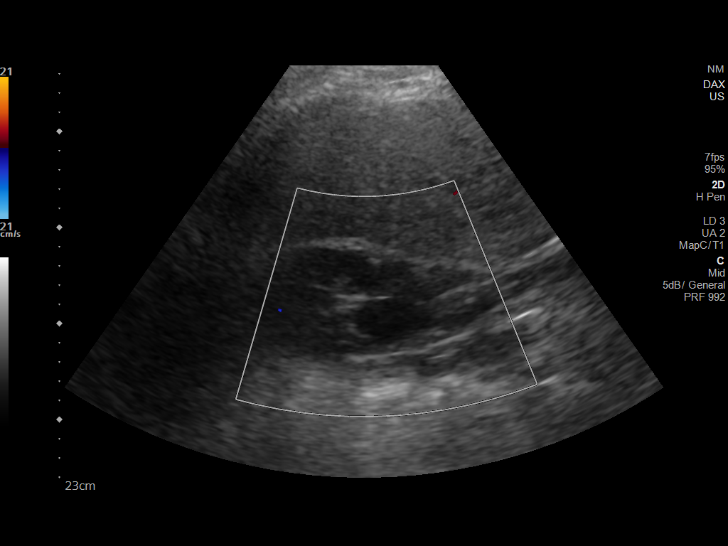
[im 20/43]
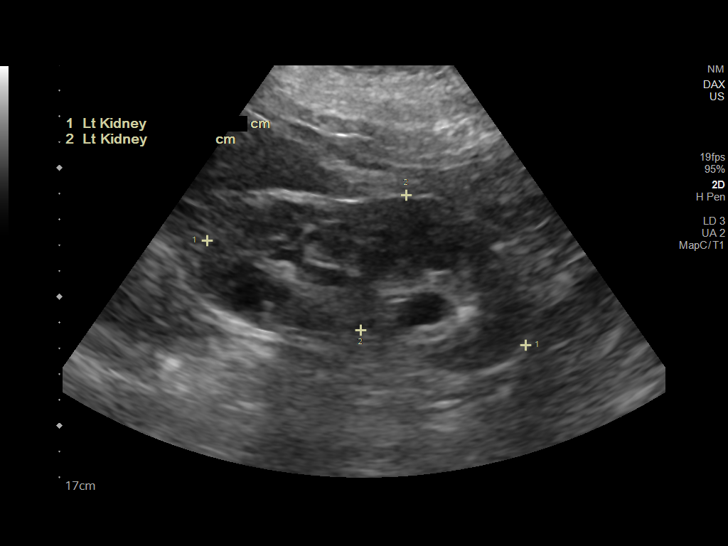
[im 23/43]
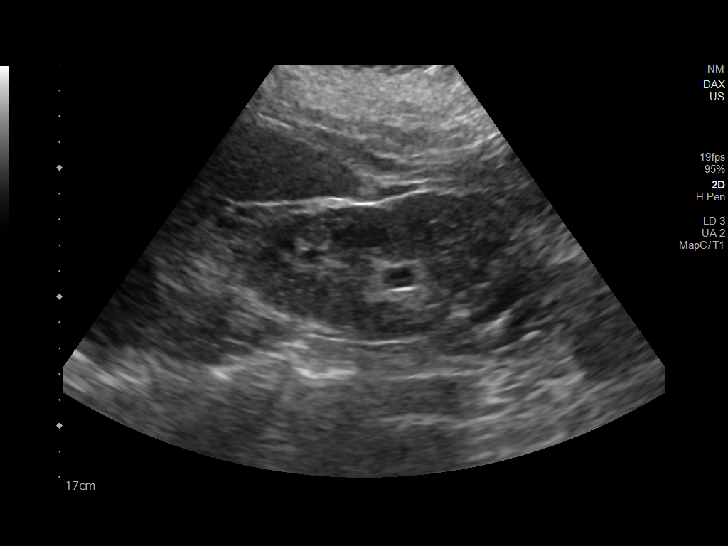
[im 27/43]
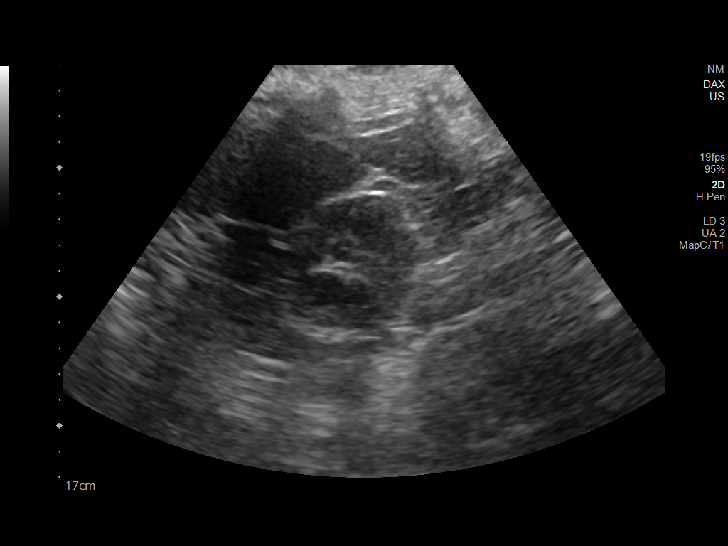
[im 29/43]
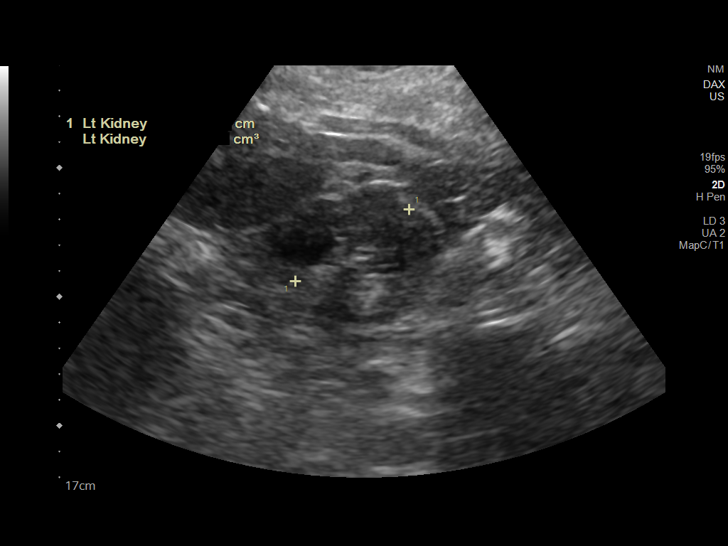
[im 32/43]
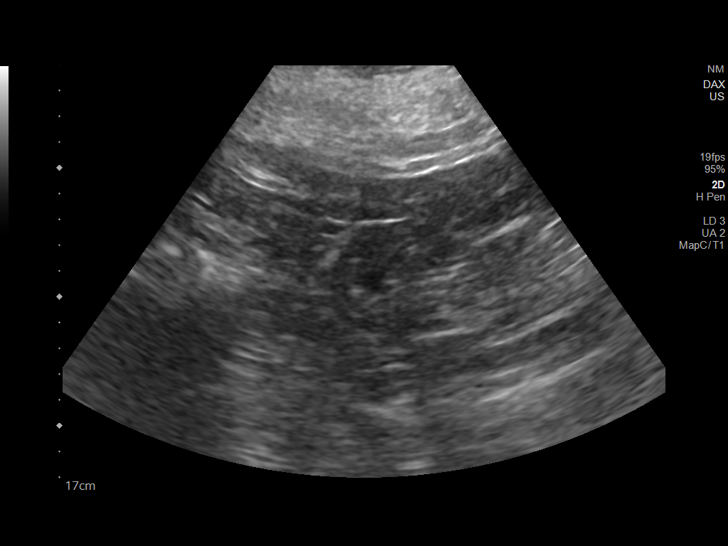
[im 36/43]
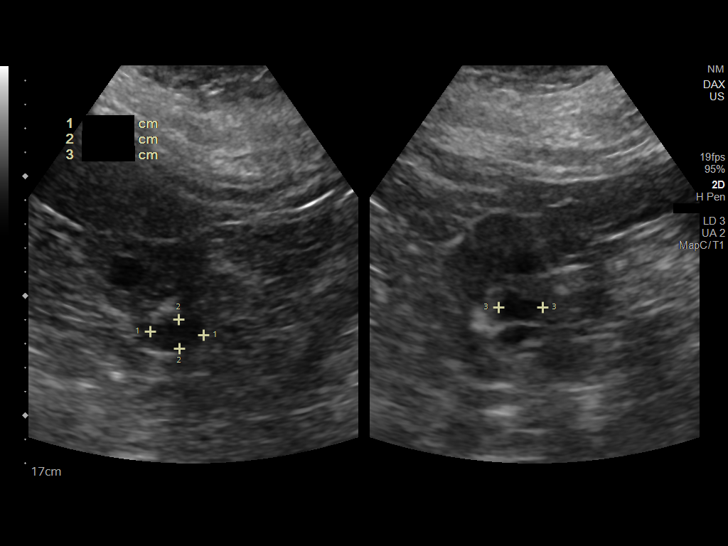
[im 39/43]
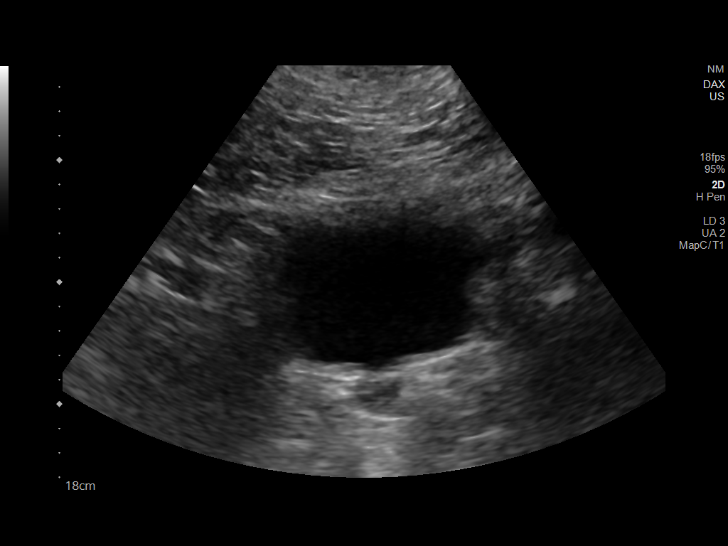
[im 43/43]
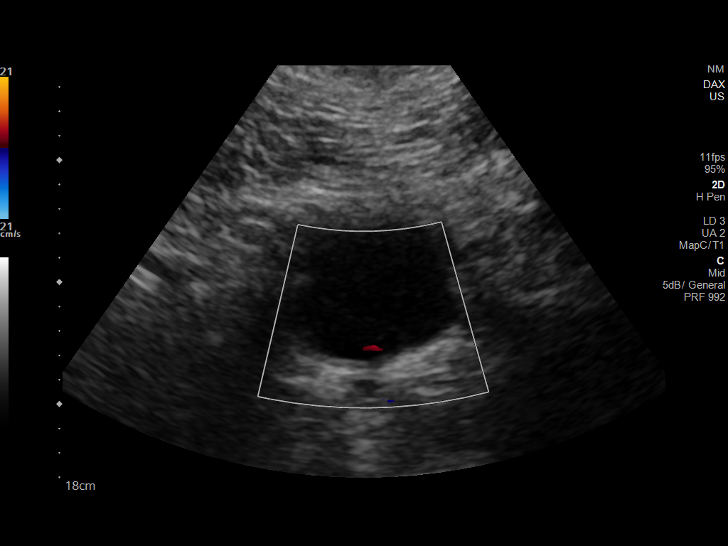

[14 of 25 positions shown; findings below may reference images not displayed]

FINDINGS: Right Kidney:

Renal measurements: 13.4 x 5.6 x 6.8 cm = volume: 263 mL. There is a
2.8 x 2.4 x 2.7 cm cyst in the lower pole. There is no
hydronephrosis.

Left Kidney:

Renal measurements: 13 x 5.6 x 5.2 cm = volume: 196 mL. Multiple
cysts are noted measuring up to approximately 2.5 cm.

Bladder:

Appears normal for degree of bladder distention.
IMPRESSION: No acute sonographic abnormality detected.

## 2020-01-14 ENCOUNTER — Ambulatory Visit: Payer: BC Managed Care – PPO | Admitting: Internal Medicine

## 2020-05-13 ENCOUNTER — Other Ambulatory Visit: Payer: Self-pay

## 2020-05-13 ENCOUNTER — Encounter: Payer: Self-pay | Admitting: Internal Medicine

## 2020-05-13 ENCOUNTER — Ambulatory Visit: Payer: Self-pay | Attending: Internal Medicine | Admitting: Internal Medicine

## 2020-05-13 VITALS — BP 107/74 | HR 96 | Resp 16 | Ht 70.0 in | Wt 286.2 lb

## 2020-05-13 DIAGNOSIS — N611 Abscess of the breast and nipple: Secondary | ICD-10-CM

## 2020-05-13 DIAGNOSIS — R7303 Prediabetes: Secondary | ICD-10-CM

## 2020-05-13 DIAGNOSIS — L02214 Cutaneous abscess of groin: Secondary | ICD-10-CM

## 2020-05-13 DIAGNOSIS — E213 Hyperparathyroidism, unspecified: Secondary | ICD-10-CM

## 2020-05-13 DIAGNOSIS — E538 Deficiency of other specified B group vitamins: Secondary | ICD-10-CM

## 2020-05-13 DIAGNOSIS — E559 Vitamin D deficiency, unspecified: Secondary | ICD-10-CM

## 2020-05-13 MED ORDER — SULFAMETHOXAZOLE-TRIMETHOPRIM 400-80 MG PO TABS: 1.0000 | ORAL_TABLET | Freq: Two times a day (BID) | ORAL | 0 refills | Status: DC

## 2020-05-13 NOTE — Progress Notes (Signed)
Patient ID: Elizabeth Campbell, female    DOB: 12-15-1980  MRN: [id number]  CC: Cyst (left breast )   Subjective: Elizabeth Campbell is a 39 y.o. female who presents for UC visit Her concerns today include:  History of tobacco dependence,pulHTN,probable OSA/OHS, obesity, vitamin D deficiency, Vit B12 def, preDM,  hypokalemia, hyperparathyroidism.   C/o recurrent painful cysts. Currently has one onLT breast, and in LT and RT groin.  Started about 1 wk ago.  One in LT groin drained a lot of puss a few days ago.  She has not had any fever.  On last visit with me back in December, we had ordered some blood tests on her including parathyroid hormone level.  She is wondering whether she could have these blood test done today.  We need to recheck her vitamin B12 level as she does have B12 deficiency.  She tells me that she continues to take B12 supplement over-the-counter.  She takes a One-A-Day woman vitamin that has vitamin D in it.   Patient Active Problem List   Diagnosis Date Noted  . Abscesses of both axillae 09/06/2019  . B12 deficiency 07/10/2019  . Vitamin D deficiency 07/10/2019  . Elevated serum immunoglobulin free light chains 07/10/2019  . Prediabetes 07/10/2019  . Glucose intolerance 06/23/2019  . Lower extremity weakness 06/20/2019  . Leukocytosis 06/20/2019  . Hydradenitis   . Tobacco use   . Hypokalemia   . Hypomagnesemia   . Weakness   . Pulmonary nodule, right 08/18/2017  . Dyspnea 04/15/2017  . Recurrent boils 03/10/2017  . Tobacco dependence 03/10/2017  . Class 3 severe obesity due to excess calories with serious comorbidity and body mass index (BMI) of 40.0 to 44.9 in adult (HCC) 03/10/2017  . Pulmonary hypertension (HCC) 02/09/2017  . Obstructive sleep apnea      Current Outpatient Medications on File Prior to Visit  Medication Sig Dispense Refill  . acetaminophen (TYLENOL) 500 MG tablet Take 1,000 mg by mouth every 6 (six) hours as needed for mild pain.      . Magnesium 250 MG TABS Take 250 mg by mouth daily.    . Melatonin 10 MG TABS Take 10 mg by mouth at bedtime.    . potassium chloride SA (KLOR-CON) 20 MEQ tablet Take 1 tablet (20 mEq total) by mouth 2 (two) times daily. 60 tablet 0  . vitamin B-12 1000 MCG tablet Take 1 tablet (1,000 mcg total) by mouth daily. 30 tablet 0   No current facility-administered medications on file prior to visit.    Allergies  Allergen Reactions  . Advil [Ibuprofen] Hives and Itching    Social History   Socioeconomic History  . Marital status: Married    Spouse name: Not on file  . Number of children: Not on file  . Years of education: Not on file  . Highest education level: Not on file  Occupational History  . Not on file  Tobacco Use  . Smoking status: Current Every Day Smoker    Packs/day: 0.25    Years: 15.00    Pack years: 3.75    Types: Cigarettes  . Smokeless tobacco: Never Used  Substance and Sexual Activity  . Alcohol use: No  . Drug use: No  . Sexual activity: Never    Birth control/protection: Surgical  Other Topics Concern  . Not on file  Social History Narrative  . Not on file   Social Determinants of Health   Financial Resource Strain:   .  Difficulty of Paying Living Expenses:   Food Insecurity:   . Worried About Programme researcher, broadcasting/film/video in the Last Year:   . Barista in the Last Year:   Transportation Needs:   . Freight forwarder (Medical):   Marland Kitchen Lack of Transportation (Non-Medical):   Physical Activity:   . Days of Exercise per Week:   . Minutes of Exercise per Session:   Stress:   . Feeling of Stress :   Social Connections:   . Frequency of Communication with Friends and Family:   . Frequency of Social Gatherings with Friends and Family:   . Attends Religious Services:   . Active Member of Clubs or Organizations:   . Attends Banker Meetings:   Marland Kitchen Marital Status:   Intimate Partner Violence:   . Fear of Current or Ex-Partner:   .  Emotionally Abused:   Marland Kitchen Physically Abused:   . Sexually Abused:     Family History  Problem Relation Age of Onset  . Hypertension Mother   . Cancer Father     Past Surgical History:  Procedure Laterality Date  . TONSILLECTOMY      ROS: Review of Systems Negative except as stated above  PHYSICAL EXAM: BP 107/74   Pulse 96   Resp 16   Ht 5\' 10"  (1.778 m)   Wt 286 lb 3.2 oz (129.8 kg)   SpO2 94%   BMI 41.07 kg/m   Wt Readings from Last 3 Encounters:  05/13/20 286 lb 3.2 oz (129.8 kg)  08/13/19 279 lb 14.4 oz (127 kg)  06/23/19 296 lb 4.8 oz (134.4 kg)    Physical Exam  General appearance - alert, well appearing, and in no distress Mental status - normal mood, behavior, speech, dress, motor activity, and thought processes Skin -3 x 3 cm boil noted in the lower inner aspect of the left breast.  The boil that was in the left groin is no longer there as it spontaneously drained per patient report.  The one in the right groin is less than a centimeter in size with a small whitehead. -Using sterile technique and having clean the skin over the boil on the left breast with Betadine, a 30-gauge needle with attached syringe was inserted.  No pus was extracted.  There was mild bleeding.  CMP Latest Ref Rng & Units 08/13/2019 07/05/2019 06/23/2019  Glucose 70 - 99 mg/dL 91 06/25/2019) 353(G)  BUN 6 - 20 mg/dL 11 12 8   Creatinine 0.44 - 1.00 mg/dL 992(E) ) 2.68(T)  Sodium 135 - 145 mmol/L 142 137 140  Potassium 3.5 - 5.1 mmol/L 3.0(LL) 3.4(L) 3.3(L)  Chloride 98 - 111 mmol/L 101 96 106  CO2 22 - 32 mmol/L 29 25 24   Calcium 8.9 - 10.3 mg/dL 9.2 9.2 4.19(Q)  Total Protein 6.5 - 8.1 g/dL 7.9 - 6.9  Total Bilirubin 0.3 - 1.2 mg/dL 0.3 - 0.6  Alkaline Phos 38 - 126 U/L 107 - 97  AST 15 - 41 U/L 21 - 42(H)  ALT 0 - 44 U/L 22 - 23   Lipid Panel  No results found for: CHOL, TRIG, HDL, CHOLHDL, VLDL, LDLCALC, LDLDIRECT  CBC    Component Value Date/Time   WBC 14.1 (H) 08/13/2019  1413   RBC 4.63 08/13/2019 1413   HGB 14.1 08/13/2019 1413   HGB 15.3 02/10/2017 1207   HCT 43.3 08/13/2019 1413   HCT 39.3 06/23/2019 0634   PLT 272 08/13/2019 1413  PLT 267 02/10/2017 1207   MCV 93.5 08/13/2019 1413   MCV 91 02/10/2017 1207   MCH 30.5 08/13/2019 1413   MCHC 32.6 08/13/2019 1413   RDW 15.7 (H) 08/13/2019 1413   RDW 18.5 (H) 02/10/2017 1207   LYMPHSABS 3.1 08/13/2019 1413   LYMPHSABS 1.8 02/10/2017 1207   MONOABS 0.5 08/13/2019 1413   EOSABS 0.4 08/13/2019 1413   EOSABS 0.2 02/10/2017 1207   BASOSABS 0.1 08/13/2019 1413   BASOSABS 0.0 02/10/2017 1207    ASSESSMENT AND PLAN: 1. Abscess of left breast I have placed patient on Bactrim.  Advised her to use warm compresses to see whether this abscess will rupture and drain on its own.  If it does not avoid increases in size, she should be seen in the emergency room for I&D - sulfamethoxazole-trimethoprim (BACTRIM) 400-80 MG tablet; Take 1 tablet by mouth 2 (two) times daily.  Dispense: 14 tablet; Refill: 0  2. Abscess of groin - sulfamethoxazole-trimethoprim (BACTRIM) 400-80 MG tablet; Take 1 tablet by mouth 2 (two) times daily.  Dispense: 14 tablet; Refill: 0  3. Prediabetes - Hemoglobin A1c  4. Vitamin D deficiency Patient on One-A-Day woman's vitamin that contains vitamin D - VITAMIN D 25 Hydroxy (Vit-D Deficiency, Fractures) - Basic Metabolic Panel  5. B12 deficiency She reports compliance with taking B12 supplement over-the-counter - Vitamin B12  6. HyperPTH [facility]) She has hypoparathyroidism.  We will check electrolytes.  She should also get a bone density scan but she is uninsured at this time.  We will wait until she has applied for the orange card/cone discount for gets insurance. - Parathyroid hormone, intact (no Ca)     Patient was given the opportunity to ask questions.  Patient verbalized understanding of the plan and was able to repeat key elements of the plan.   Orders Placed This  Encounter  Procedures  . Vitamin B12  . VITAMIN D 25 Hydroxy (Vit-D Deficiency, Fractures)  . Parathyroid hormone, intact (no Ca)  . Basic Metabolic Panel  . Hemoglobin A1c     Requested Prescriptions   Signed Prescriptions Disp Refills  . sulfamethoxazole-trimethoprim (BACTRIM) 400-80 MG tablet 14 tablet 0    Sig: Take 1 tablet by mouth 2 (two) times daily.    No follow-ups on file.  Jonah Blue, MD, FACP

## 2020-05-13 NOTE — Patient Instructions (Signed)
I have sent a prescription to the pharmacy for the antibiotic called Bactrim.  You should take it twice a day for 7 days.  If the abscess on the left breast gets worse or becomes larger, you should be seen in the emergency room to have it lanced.

## 2020-05-14 ENCOUNTER — Ambulatory Visit: Payer: Self-pay | Admitting: Internal Medicine

## 2020-05-14 ENCOUNTER — Telehealth: Payer: Self-pay | Admitting: Internal Medicine

## 2020-05-14 ENCOUNTER — Encounter: Payer: Self-pay | Admitting: Internal Medicine

## 2020-05-14 LAB — BASIC METABOLIC PANEL
BUN/Creatinine Ratio: 14 (ref 9–23)
BUN: 14 mg/dL (ref 6–20)
CO2: 25 mmol/L (ref 20–29)
Calcium: 9.2 mg/dL (ref 8.7–10.2)
Chloride: 97 mmol/L (ref 96–106)
Creatinine, Ser: 0.97 mg/dL (ref 0.57–1.00)
GFR calc Af Amer: 86 mL/min/{1.73_m2} (ref >59)
GFR calc non Af Amer: 74 mL/min/{1.73_m2} (ref >59)
Glucose: 101 mg/dL — ABNORMAL HIGH (ref 65–99)
Potassium: 3.4 mmol/L — ABNORMAL LOW (ref 3.5–5.2)
Sodium: 138 mmol/L (ref 134–144)

## 2020-05-14 LAB — PARATHYROID HORMONE, INTACT (NO CA): PTH: 38 pg/mL (ref 15–65)

## 2020-05-14 LAB — VITAMIN B12: Vitamin B-12: 916 pg/mL (ref 232–1245)

## 2020-05-14 LAB — VITAMIN D 25 HYDROXY (VIT D DEFICIENCY, FRACTURES): Vit D, 25-Hydroxy: 29.1 ng/mL — ABNORMAL LOW (ref 30.0–100.0)

## 2020-05-14 LAB — HEMOGLOBIN A1C
Est. average glucose Bld gHb Est-mCnc: 120 mg/dL
Hgb A1c MFr Bld: 5.8 % — ABNORMAL HIGH (ref 4.8–5.6)

## 2020-05-14 MED ORDER — PREDNISONE 20 MG PO TABS
30.0000 mg | ORAL_TABLET | Freq: Every day | ORAL | 0 refills | Status: DC
Start: 2020-05-14 — End: 2022-11-19

## 2020-05-14 MED ORDER — CLINDAMYCIN HCL 300 MG PO CAPS: 300.0000 mg | ORAL_CAPSULE | Freq: Three times a day (TID) | ORAL | 0 refills | Status: DC

## 2020-05-14 NOTE — Addendum Note (Signed)
Addended by: Jonah Blue B on: 05/14/2020 07:06 PM   Modules accepted: Orders

## 2020-05-14 NOTE — Telephone Encounter (Signed)
Will forward to pcp

## 2020-05-14 NOTE — Telephone Encounter (Signed)
This should be routed to Dr. Laural Benes.

## 2020-05-14 NOTE — Telephone Encounter (Signed)
PC placed to pt.  I left VMM informing her to stop the Bactrim.  I will send rxn to her pharmacy for a different abx.  Advise to take Benadryl for the reaction she is having to bactrim.  I will also send rxn for Prednisone to take with the Benadryl.  If she is having problems breathing, she should be seen in the ER.

## 2020-05-14 NOTE — Telephone Encounter (Signed)
Pt states she had a reaction to taking the sulfamethoxazole-trimethoprim (BACTRIM) 400-80 MG tablet /she had swelling of the hands and face and itching all over and felt hot / Pt also stated it was hard to breathe / please advise

## 2020-05-14 NOTE — Telephone Encounter (Signed)
Pt reports took first dose of Bactrim yesterday for abscess. States 10 minutes after taking developed generalized red raised rash, severe itching, hands, fingers swollen. States took benadryl, "Went to sleep"  all symptoms now resolved. Assured pt NT would route to practice for PCPs review. Advised to not take additional doses of med.  Pt states she would like to try Keflex if appropriate, per mother's advise. CB# [phone]  Reason for Disposition . [1] Caller has URGENT medicine question about med that PCP or specialist prescribed AND [2] triager unable to answer question  Answer Assessment - Initial Assessment Questions 1. NAME of MEDICATION: "What medicine are you calling about?"     BActrim 2. QUESTION: "What is your question?" (e.g., medication refill, side effect)      3. PRESCRIBING HCP: "Who prescribed it?" Reason: if prescribed by specialist, call should be referred to that group.     Abscess 4. SYMPTOMS: "Do you have any symptoms?"     Yesterday, itching, rash, swelling 5. SEVERITY: If symptoms are present, ask "Are they mild, moderate or severe?"    Resolved now  Protocols used: MEDICATION QUESTION CALL-A-AH

## 2020-05-14 NOTE — Telephone Encounter (Signed)
Pt called back to see what next steps would be as far as medication . Since she had an allergic reaction to the first meds prescribed please advise

## 2022-11-18 ENCOUNTER — Ambulatory Visit: Payer: Self-pay | Admitting: *Deleted

## 2022-11-18 NOTE — Telephone Encounter (Signed)
  Chief Complaint: rash all over her body Symptoms: Red rash that is itching and burning all over her body.   Doesn't know what is causing it. Frequency: Started Sunday Pertinent Negatives: Patient denies new medications or changes in food, etc. Disposition: [] ED /[] Urgent Care (no appt availability in office) / [x] Appointment(In office/virtual)/ []  [place] [facility]/ [] Home Care/ [] Refused Recommended Disposition /[] [place] [facility]/ []  Follow-up with PCP Additional Notes: Appt. Made with Geryl Rankins, NP for 11/19/2022 at 10:30.   If she decides to go on to the urgent care today she will call and cancel this appt

## 2022-11-18 NOTE — Telephone Encounter (Signed)
Reason for Disposition  Mild widespread rash  (Exception: Heat rash lasting 3 days or less.)  Answer Assessment - Initial Assessment Questions 1. APPEARANCE of RASH: "Describe the rash." (e.g., spots, blisters, raised areas, skin peeling, scaly)     Breaking out in a rash all over.   Hives everywhere.  My skin hurts.    Started Sunday 2. SIZE: "How big are the spots?" (e.g., tip of pen, eraser, coin; inches, centimeters)     They are raised bumps 3. LOCATION: "Where is the rash located?"     All over my body 4. COLOR: "What color is the rash?" (Note: It is difficult to assess rash color in people with darker-colored skin. When this situation occurs, simply ask the caller to describe what they see.)     Red 5. ONSET: "When did the rash begin?"     Sunday 6. FEVER: "Do you have a fever?" If Yes, ask: "What is your temperature, how was it measured, and when did it start?"     Not asked 7. ITCHING: "Does the rash itch?" If Yes, ask: "How bad is the itch?" (Scale 1-10; or mild, moderate, severe)     Itching and burning 8. CAUSE: "What do you think is causing the rash?"     I don't know Occasionally I will break out like this.    It's never continued like this. 9. MEDICINE FACTORS: "Have you started any new medicines within the last 2 weeks?" (e.g., antibiotics)      No new medications 10. OTHER SYMPTOMS: "Do you have any other symptoms?" (e.g., dizziness, headache, sore throat, joint pain)       No other symptoms  11. PREGNANCY: "Is there any chance you are pregnant?" "When was your last menstrual period?"       I'm living on Benadryl It's not helping.  Protocols used: Rash or Redness - [facility]

## 2022-11-19 ENCOUNTER — Ambulatory Visit: Payer: Self-pay | Attending: Nurse Practitioner | Admitting: Nurse Practitioner

## 2022-11-19 ENCOUNTER — Encounter: Payer: Self-pay | Admitting: Nurse Practitioner

## 2022-11-19 VITALS — BP 125/81 | HR 98 | Ht 70.0 in | Wt 277.0 lb

## 2022-11-19 DIAGNOSIS — E876 Hypokalemia: Secondary | ICD-10-CM

## 2022-11-19 DIAGNOSIS — R7303 Prediabetes: Secondary | ICD-10-CM

## 2022-11-19 DIAGNOSIS — R21 Rash and other nonspecific skin eruption: Secondary | ICD-10-CM

## 2022-11-19 MED ORDER — PREDNISONE 20 MG PO TABS: 40.0000 mg | ORAL_TABLET | Freq: Every day | ORAL | 0 refills | Status: AC

## 2022-11-19 NOTE — Progress Notes (Unsigned)
Onset Sunday of rash. Prednisone shot at UC made it worse.

## 2022-11-19 NOTE — Progress Notes (Signed)
Assessment & Plan:  Concetta was seen today for rash.  Diagnoses and all orders for this visit:  Skin rash -     predniSONE (DELTASONE) 20 MG tablet; Take 2 tablets (40 mg total) by mouth daily for 7 days. -     CBC with Differential  Hypokalemia -     CMP14+EGFR  Prediabetes -     Hemoglobin A1c    Patient has been counseled on age-appropriate routine health concerns for screening and prevention. These are reviewed and up-to-date. Referrals have been placed accordingly. Immunizations are up-to-date or declined.    Subjective:   Chief Complaint  Patient presents with   Rash    First started Sunday 11/14/22.  Prednisone shot at UC made it worse.   HPI JOCELL GERHOLD 42 y.o. female presents to office today with concerns of generalized rash over the past few days. Associated symptoms: pruritus.  She denies starting any new medications, using any new soaps or detergents, or eating any new foods. She has broken out in the past before but rash did not linger as it is now. Treatments tried at home: benadryl.    Prediabetes Well controlled at this time with diet only.  Lab Results  Component Value Date   HGBA1C 5.4 11/19/2022       Review of Systems  Constitutional:  Negative for fever, malaise/fatigue and weight loss.  HENT: Negative.  Negative for nosebleeds.   Eyes: Negative.  Negative for blurred vision, double vision and photophobia.  Respiratory: Negative.  Negative for cough and shortness of breath.   Cardiovascular: Negative.  Negative for chest pain, palpitations and leg swelling.  Gastrointestinal: Negative.  Negative for heartburn, nausea and vomiting.  Musculoskeletal: Negative.  Negative for myalgias.  Skin:  Positive for itching and rash.  Neurological: Negative.  Negative for dizziness, focal weakness, seizures and headaches.  Psychiatric/Behavioral: Negative.  Negative for suicidal ideas.     Past Medical History:  Diagnosis Date   Hydradenitis     Hypokalemia    Hypomagnesemia    Obesity (BMI 30-39.9)    Obesity hypoventilation syndrome (HCC)    Tobacco use     Past Surgical History:  Procedure Laterality Date   TONSILLECTOMY      Family History  Problem Relation Age of Onset   Hypertension Mother    Cancer Father     Social History Reviewed with no changes to be made today.   Outpatient Medications Prior to Visit  Medication Sig Dispense Refill   acetaminophen (TYLENOL) 500 MG tablet Take 1,000 mg by mouth every 6 (six) hours as needed for mild pain.     Magnesium 250 MG TABS Take 250 mg by mouth daily.     vitamin B-12 1000 MCG tablet Take 1 tablet (1,000 mcg total) by mouth daily. 30 tablet 0   Melatonin 10 MG TABS Take 10 mg by mouth at bedtime. (Patient not taking: Reported on 11/19/2022)     clindamycin (CLEOCIN) 300 MG capsule Take 1 capsule (300 mg total) by mouth 3 (three) times daily. (Patient not taking: Reported on 11/19/2022) 21 capsule 0   potassium chloride SA (KLOR-CON) 20 MEQ tablet Take 1 tablet (20 mEq total) by mouth 2 (two) times daily. (Patient not taking: Reported on 11/19/2022) 60 tablet 0   predniSONE (DELTASONE) 20 MG tablet Take 1.5 tablets (30 mg total) by mouth daily. (Patient not taking: Reported on 11/19/2022) 5 tablet 0   No facility-administered medications prior to visit.  Allergies  Allergen Reactions   Cetirizine Hives   Advil [Ibuprofen] Hives and Itching   Bactrim [Sulfamethoxazole-Trimethoprim] Itching    Itching, face swollen and problems breathing       Objective:    BP 125/81   Pulse 98   Ht 5' 10"$  (1.778 m)   Wt 277 lb (125.6 kg)   LMP 08/26/2022 (Approximate)   SpO2 98%   BMI 39.75 kg/m  Wt Readings from Last 3 Encounters:  11/19/22 277 lb (125.6 kg)  05/13/20 286 lb 3.2 oz (129.8 kg)  08/13/19 279 lb 14.4 oz (127 kg)    Physical Exam Vitals and nursing note reviewed.  Constitutional:      Appearance: She is well-developed.  HENT:     Head: Normocephalic  and atraumatic.  Cardiovascular:     Rate and Rhythm: Normal rate and regular rhythm.     Heart sounds: Normal heart sounds. No murmur heard.    No friction rub. No gallop.  Pulmonary:     Effort: Pulmonary effort is normal. No tachypnea or respiratory distress.     Breath sounds: Normal breath sounds. No decreased breath sounds, wheezing, rhonchi or rales.  Chest:     Chest wall: No tenderness.  Abdominal:     General: Bowel sounds are normal.     Palpations: Abdomen is soft.  Musculoskeletal:        General: Normal range of motion.     Cervical back: Normal range of motion.  Skin:    General: Skin is warm and dry.     Findings: Erythema and rash present.     Comments: Erythematous macular rash on arms and torso. No visible drainage or signs of cellulitis.   Neurological:     Mental Status: She is alert and oriented to person, place, and time.     Coordination: Coordination normal.  Psychiatric:        Behavior: Behavior normal. Behavior is cooperative.        Thought Content: Thought content normal.        Judgment: Judgment normal.          Patient has been counseled extensively about nutrition and exercise as well as the importance of adherence with medications and regular follow-up. The patient was given clear instructions to go to ER or return to medical center if symptoms don't improve, worsen or new problems develop. The patient verbalized understanding.   Follow-up: Return if symptoms worsen or fail to improve.   Gildardo Pounds, FNP-BC [facility] [place], [place]   11/23/2022, 6:22 PM

## 2022-11-20 LAB — CBC WITH DIFFERENTIAL/PLATELET
Basophils Absolute: 0 10*3/uL (ref 0.0–0.2)
Basos: 0 %
EOS (ABSOLUTE): 0.2 10*3/uL (ref 0.0–0.4)
Eos: 2 %
Hematocrit: 45.6 % (ref 34.0–46.6)
Hemoglobin: 14.9 g/dL (ref 11.1–15.9)
Immature Grans (Abs): 0.1 10*3/uL (ref 0.0–0.1)
Immature Granulocytes: 1 %
Lymphocytes Absolute: 2.3 10*3/uL (ref 0.7–3.1)
Lymphs: 21 %
MCH: 30.6 pg (ref 26.6–33.0)
MCHC: 32.7 g/dL (ref 31.5–35.7)
MCV: 94 fL (ref 79–97)
Monocytes Absolute: 0.5 10*3/uL (ref 0.1–0.9)
Monocytes: 4 %
Neutrophils Absolute: 8.1 10*3/uL — ABNORMAL HIGH (ref 1.4–7.0)
Neutrophils: 72 %
Platelets: 263 10*3/uL (ref 150–450)
RBC: 4.87 x10E6/uL (ref 3.77–5.28)
RDW: 13.4 % (ref 11.7–15.4)
WBC: 11.1 10*3/uL — ABNORMAL HIGH (ref 3.4–10.8)

## 2022-11-20 LAB — CMP14+EGFR
ALT: 15 IU/L (ref 0–32)
AST: 18 IU/L (ref 0–40)
Albumin/Globulin Ratio: 1.1 — ABNORMAL LOW (ref 1.2–2.2)
Albumin: 3.9 g/dL (ref 3.9–4.9)
Alkaline Phosphatase: 122 IU/L — ABNORMAL HIGH (ref 44–121)
BUN/Creatinine Ratio: 18 (ref 9–23)
BUN: 32 mg/dL — ABNORMAL HIGH (ref 6–24)
Bilirubin Total: <0.2 mg/dL (ref 0.0–1.2)
CO2: 19 mmol/L — ABNORMAL LOW (ref 20–29)
Calcium: 9.3 mg/dL (ref 8.7–10.2)
Chloride: 103 mmol/L (ref 96–106)
Creatinine, Ser: 1.79 mg/dL — ABNORMAL HIGH (ref 0.57–1.00)
Globulin, Total: 3.7 g/dL (ref 1.5–4.5)
Glucose: 92 mg/dL (ref 70–99)
Potassium: 4.5 mmol/L (ref 3.5–5.2)
Sodium: 140 mmol/L (ref 134–144)
Total Protein: 7.6 g/dL (ref 6.0–8.5)
eGFR: 36 mL/min/{1.73_m2} — ABNORMAL LOW (ref >59)

## 2022-11-20 LAB — HEMOGLOBIN A1C
Est. average glucose Bld gHb Est-mCnc: 108 mg/dL
Hgb A1c MFr Bld: 5.4 % (ref 4.8–5.6)

## 2022-11-23 ENCOUNTER — Encounter: Payer: Self-pay | Admitting: Nurse Practitioner

## 2022-12-20 ENCOUNTER — Ambulatory Visit: Payer: Self-pay | Admitting: *Deleted

## 2022-12-20 NOTE — Telephone Encounter (Signed)
  Chief Complaint: Rash Symptoms: Red raised rash legs, back, stomach,arms. States had similar rash 1 month ago, seen OV,  RX prednisone. Severe itching ,burning "Painful where raised" Frequency: Saturday night Pertinent Negatives: Patient denies... No new meds, cosmetics, foods, detergents. Denies SOB, swelling. Disposition: [] ED /[] Urgent Care (no appt availability in office) / [] Appointment(In office/virtual)/ []  [place] [facility]/ [] Home Care/ [] Refused Recommended Disposition /[x] [place] [facility]/ []  Follow-up with PCP Additional Notes: Pt states rash is painful. [facility] as no availability within protocol time frame 4 hours. States will follow disposition. Care advise provided, verbalizes understanding. Reason for Disposition  [1] Purple or blood-colored rash (spots or dots) AND [2] no fever AND [3] sounds well to triager    Rash is painful  Answer Assessment - Initial Assessment Questions 1. APPEARANCE of RASH: "Describe the rash." (e.g., spots, blisters, raised areas, skin peeling, scaly)     Red raised, itchy 2. SIZE: "How big are the spots?" (e.g., tip of pen, eraser, coin; inches, centimeters)     Varies 3. LOCATION: "Where is the rash located?"     Legs, back, stomach, arms 4. COLOR: "What color is the rash?" (Note: It is difficult to assess rash color in people with darker-colored skin. When this situation occurs, simply ask the caller to describe what they see.)     Red 5. ONSET: "When did the rash begin?"     Saturday night 6. FEVER: "Do you have a fever?" If Yes, ask: "What is your temperature, how was it measured, and when did it start?"     no 7. ITCHING: "Does the rash itch?" If Yes, ask: "How bad is the itch?" (Scale 1-10; or mild, moderate, severe)     Itchy, burning 8. CAUSE: "What do you think is causing the rash?"     Unsure 9. MEDICINE FACTORS: "Have you started any new medicines within the last 2 weeks?" (e.g., antibiotics)      No 10.  OTHER SYMPTOMS: "Do you have any other symptoms?" (e.g., dizziness, headache, sore throat, joint pain)       No  Protocols used: Rash or Redness - [facility]

## 2022-12-22 NOTE — Telephone Encounter (Signed)
Noted  

## 2023-05-04 ENCOUNTER — Ambulatory Visit: Payer: Self-pay | Attending: Physician Assistant | Admitting: Physician Assistant

## 2023-05-04 ENCOUNTER — Encounter: Payer: Self-pay | Admitting: Physician Assistant

## 2023-05-04 VITALS — BP 127/84 | HR 85 | Wt 286.8 lb

## 2023-05-04 DIAGNOSIS — R7989 Other specified abnormal findings of blood chemistry: Secondary | ICD-10-CM

## 2023-05-04 DIAGNOSIS — E559 Vitamin D deficiency, unspecified: Secondary | ICD-10-CM

## 2023-05-04 DIAGNOSIS — N912 Amenorrhea, unspecified: Secondary | ICD-10-CM

## 2023-05-04 DIAGNOSIS — L0292 Furuncle, unspecified: Secondary | ICD-10-CM

## 2023-05-04 DIAGNOSIS — L659 Nonscarring hair loss, unspecified: Secondary | ICD-10-CM

## 2023-05-04 LAB — POCT URINE PREGNANCY: Preg Test, Ur: NEGATIVE

## 2023-05-04 NOTE — Progress Notes (Signed)
Patient ID: Elizabeth Campbell, female   DOB: 1980/12/08, 42 y.o.   MRN: [id number]   Elizabeth Campbell, is a 42 y.o. female  [id number]  [id number]  DOB - 10/11/1980  Chief Complaint  Patient presents with   Fatigue   Amenorrhea    Last 4 months       Subjective:   [name] is a 42 y.o. female here today for increased hair loss.  No period in 4 months.  No h/o PCOS but periods have always been really heavy and sometimes twice a month.  Her mom went through menopause in her early 45s.  No hot flashes.  Not using BC.  Home preg neg.  Office uhcg neg.  Also gets recurrent boils and recently had to have one lanced.     No problems updated.  ALLERGIES: Allergies  Allergen Reactions   Cetirizine Hives   Advil [Ibuprofen] Hives and Itching   Bactrim [Sulfamethoxazole-Trimethoprim] Itching    Itching, face swollen and problems breathing    PAST MEDICAL HISTORY: Past Medical History:  Diagnosis Date   Hydradenitis    Hypokalemia    Hypomagnesemia    Obesity (BMI 30-39.9)    Obesity hypoventilation syndrome (HCC)    Tobacco use     MEDICATIONS AT HOME: Prior to Admission medications   Medication Sig Start Date End Date Taking? Authorizing Provider  acetaminophen (TYLENOL) 500 MG tablet Take 1,000 mg by mouth every 6 (six) hours as needed for mild pain.   Yes [provider]  Magnesium 250 MG TABS Take 250 mg by mouth daily.   Yes [provider]  vitamin B-12 1000 MCG tablet Take 1 tablet (1,000 mcg total) by mouth daily. 06/24/19  Yes Calvert Cantor, MD  Melatonin 10 MG TABS Take 10 mg by mouth at bedtime. Patient not taking: Reported on 11/19/2022    [provider]    ROS: Neg HEENT Neg resp Neg cardiac Neg GI Neg GU Neg MS Neg psych Neg neuro  Objective:   Vitals:   05/04/23 1425  BP: 127/84  Pulse: 85  SpO2: 91%  Weight: 286 lb 12.8 oz (130.1 kg)   Exam General appearance : Awake, alert, not in any distress. Speech  Clear. Not toxic looking HEENT: Atraumatic and Normocephalic Neck: Supple, no JVD. No cervical lymphadenopathy.  Chest: Good air entry bilaterally, CTAB.  No rales/rhonchi/wheezing CVS: S1 S2 regular, no murmurs.  Extremities: B/L Lower Ext shows no edema, both legs are warm to touch Neurology: Awake alert, and oriented X 3, CN II-XII intact, Non focal Skin: No Rash  Data Review Lab Results  Component Value Date   HGBA1C 5.4 11/19/2022   HGBA1C 5.8 (H) 05/13/2020   HGBA1C 6.0 (H) 06/20/2019    Assessment & Plan   1. Amenorrhea ?PCOS vs perimenopause - POCT urine pregnancy - TSH - FSH/LH - Testosterone, free  2. Hair loss - TSH - FSH/LH - Testosterone, free  3. Recurrent boils - Testosterone, free  4. Vitamin D deficiency - Vitamin D, 25-hydroxy  5. Abnormal serum creatinine level Increase water intake(she has a Dr Reino Kent with her) - Basic metabolic panel    Return if symptoms worsen or fail to improve.  The patient was given clear instructions to go to ER or return to medical center if symptoms don't improve, worsen or new problems develop. The patient verbalized understanding. The patient was told to call to get lab results if they haven't heard anything in the next week.  Georgian Co, PA-C [facility] [place], [place] [phone]   05/04/2023, 2:50 PM

## 2023-05-06 ENCOUNTER — Telehealth: Payer: Self-pay

## 2023-05-06 NOTE — Telephone Encounter (Signed)
-----   Message from Georgian Co sent at 05/06/2023 11:26 AM EDT ----- Your hormones do not appear menopausal.  Testosterone, FSH, LH, and thyroid are all hormones and are normal.  Kidneys show mild dehydration.  Make sure you are drinking ~80 ounces water daily. Vitamin D is on the low side of normal.  I recommend taking 1000 units vitamin D daily.  Thanks, Georgian Co, PA-C

## 2023-05-06 NOTE — Telephone Encounter (Signed)
Called patient following up on results and  has seen them in [place]

## 2023-05-10 ENCOUNTER — Emergency Department (HOSPITAL_COMMUNITY): Payer: Self-pay

## 2023-05-10 ENCOUNTER — Emergency Department (HOSPITAL_COMMUNITY)
Admission: EM | Admit: 2023-05-10 | Discharge: 2023-05-11 | Disposition: A | Discharge status: Home / Self Care | Payer: Self-pay | Attending: Emergency Medicine | Admitting: Emergency Medicine

## 2023-05-10 ENCOUNTER — Other Ambulatory Visit: Payer: Self-pay

## 2023-05-10 DIAGNOSIS — S82831A Other fracture of upper and lower end of right fibula, initial encounter for closed fracture: Secondary | ICD-10-CM | POA: Insufficient documentation

## 2023-05-10 DIAGNOSIS — W010XXA Fall on same level from slipping, tripping and stumbling without subsequent striking against object, initial encounter: Secondary | ICD-10-CM | POA: Insufficient documentation

## 2023-05-10 MED ORDER — HYDROCODONE-ACETAMINOPHEN 5-325 MG PO TABS
1.0000 | ORAL_TABLET | Freq: Once | ORAL | Status: AC
  Administered 2023-05-11: 1 via ORAL
  Filled 2023-05-10: qty 1

## 2023-05-10 MED ORDER — OXYCODONE-ACETAMINOPHEN 5-325 MG PO TABS: 1.0000 | ORAL_TABLET | Freq: Once | ORAL | Status: DC

## 2023-05-10 NOTE — ED Triage Notes (Signed)
Pt reports a few hours ago tripped over a dog and fell onto her right ankle, pt reporting pain and swelling

## 2023-05-11 MED ORDER — HYDROCODONE-ACETAMINOPHEN 5-325 MG PO TABS: 1.0000 | ORAL_TABLET | ORAL | 0 refills | Status: AC | PRN

## 2023-05-11 NOTE — Progress Notes (Signed)
Orthopedic Tech Progress Note Patient Details:  Elizabeth Campbell 09-07-1981 [id number]  Ortho Devices Type of Ortho Device: Short leg splint, Stirrup splint Ortho Device/Splint Location: RLE Ortho Device/Splint Interventions: Ordered, Application, Adjustment   Post Interventions Patient Tolerated: Well Instructions Provided: Adjustment of device, Care of device, Poper ambulation with device  Elizabeth Campbell 05/11/2023, 12:29 AM

## 2023-05-11 NOTE — ED Notes (Signed)
Discharge instructions reviewed and encouraged to follow up as directed with orthopedics.   Verified prescribed medications for allergies and appropriateness.   Pt verbalizes all understand of teaching. Opportunity for questions and concerns provided.   Driven home by daughter.

## 2023-05-11 NOTE — Discharge Instructions (Addendum)
Take the prescribed medication as directed.  Can reserve norco for only severe pain if desired, can take motrin up to 800mg  3x daily in addition to this.  I would avoid taking extra tylenol since hydrocodone contains this. Follow-up with orthopedics-- call in the morning to get this scheduled. Return to the ED for new or worsening symptoms.

## 2023-05-11 NOTE — ED Provider Notes (Signed)
[place] EMERGENCY DEPARTMENT AT [facility] Provider Note   CSN: [id number] Arrival date & time: 05/10/23  2212     History {Add pertinent medical, surgical, social history, OB history to HPI:1} Chief Complaint  Patient presents with   Ankle Pain    Elizabeth Campbell is a 42 y.o. female.  The history is provided by the patient and medical records.  Ankle Pain  42 year old female presenting to the ED with right ankle pain after tripping over her dog.  States ankle twisted somehow.  She did try to get up and ambulate after but could not as she was having too much pain.  There was no head injury or loss of consciousness.  She denies any numbness or tingling.  No intervention tried prior to arrival.  Home Medications Prior to Admission medications   Medication Sig Start Date End Date Taking? Authorizing Provider  acetaminophen (TYLENOL) 500 MG tablet Take 1,000 mg by mouth every 6 (six) hours as needed for mild pain.    [provider]  Magnesium 250 MG TABS Take 250 mg by mouth daily.    [provider]  Melatonin 10 MG TABS Take 10 mg by mouth at bedtime. Patient not taking: Reported on 11/19/2022    [provider]  vitamin B-12 1000 MCG tablet Take 1 tablet (1,000 mcg total) by mouth daily. 06/24/19   Calvert Cantor, MD      Allergies    Cetirizine, Advil [ibuprofen], and Bactrim [sulfamethoxazole-trimethoprim]    Review of Systems   Review of Systems  Musculoskeletal:  Positive for arthralgias.  All other systems reviewed and are negative.   Physical Exam Updated Vital Signs BP (!) 147/87 (BP Location: Right Arm)   Pulse 97   Temp 98.9 F (37.2 C) (Oral)   Resp 18   LMP 12/15/2022 Comment: reports was 4 monhs ago and states menopasual  SpO2 99%   Physical Exam Vitals and nursing note reviewed.  Constitutional:      Appearance: She is well-developed.  HENT:     Head: Normocephalic and atraumatic.  Eyes:      Conjunctiva/sclera: Conjunctivae normal.     Pupils: Pupils are equal, round, and reactive to light.  Cardiovascular:     Rate and Rhythm: Normal rate and regular rhythm.     Heart sounds: Normal heart sounds.  Pulmonary:     Effort: Pulmonary effort is normal.     Breath sounds: Normal breath sounds.  Abdominal:     General: Bowel sounds are normal.     Palpations: Abdomen is soft.  Musculoskeletal:        General: Normal range of motion.     Cervical back: Normal range of motion.     Comments: Right ankle is diffusely swollen, there is tenderness along the lateral aspect, no gross deformity, DP pulse intact, moving toes as normal  Skin:    General: Skin is warm and dry.  Neurological:     Mental Status: She is alert and oriented to person, place, and time.     ED Results / Procedures / Treatments   Labs (all labs ordered are listed, but only abnormal results are displayed) Labs Reviewed - No data to display  EKG None  Radiology DG Ankle Complete Right  Result Date: 05/10/2023 CLINICAL DATA:  Right ankle fracture. EXAM: RIGHT ANKLE - COMPLETE 3+ VIEW COMPARISON:  None Available. FINDINGS: There is moderate circumferential soft tissue swelling. Normal bone mineralization. There is an acute closed  oblique lateral malleolar fracture. The distal fragment is displaced posteriorly and laterally by 1 cortex width. There is no further evidence of fractures, but there is noted widening along the medial mortise and slight narrowing of the lateral mortise consistent with ligamentous injury. The talar dome contour is preserved. There is a normal variant talar beak. There is a small noninflammatory plantar calcaneal spur. Incidentally noted is pes planus and mild midfoot sag. Arthritic changes are not seen. IMPRESSION: 1. Acute closed oblique lateral malleolar fracture with 1 cortex width posterior and lateral displacement of the distal fragment. 2. Widening along the medial mortise and slight  narrowing of the lateral mortise consistent with ligamentous injury. 3. Diffuse swelling. 4. Pes planus and midfoot sag. 5. Small plantar heel spur. Electronically Signed   By: Almira Bar M.D.   On: 05/10/2023 22:45    Procedures Procedures  {Document cardiac monitor, telemetry assessment procedure when appropriate:1}  Medications Ordered in ED Medications  HYDROcodone-acetaminophen (NORCO/VICODIN) 5-325 MG per tablet 1 tablet (has no administration in time range)    ED Course/ Medical Decision Making/ A&P   {   Click here for ABCD2, HEART and other calculatorsREFRESH Note before signing :1}                              Medical Decision Making Amount and/or Complexity of Data Reviewed Radiology: ordered.  Risk Prescription drug management.   ***  {Document critical care time when appropriate:1} {Document review of labs and clinical decision tools ie heart score, Chads2Vasc2 etc:1}  {Document your independent review of radiology images, and any outside records:1} {Document your discussion with family members, caretakers, and with consultants:1} {Document social determinants of health affecting pt's care:1} {Document your decision making why or why not admission, treatments were needed:1} Final Clinical Impression(s) / ED Diagnoses Final diagnoses:  None    Rx / DC Orders ED Discharge Orders     None

## 2023-05-13 ENCOUNTER — Other Ambulatory Visit: Payer: Self-pay

## 2023-05-13 ENCOUNTER — Encounter (HOSPITAL_BASED_OUTPATIENT_CLINIC_OR_DEPARTMENT_OTHER): Payer: Self-pay | Admitting: Orthopaedic Surgery

## 2023-05-15 NOTE — H&P (Signed)
ORTHOPAEDIC SURGERY H&P  Subjective:  The patient presents with right ankle fx.   Past Medical History:  Diagnosis Date   Hydradenitis    Hypokalemia    Hypomagnesemia    Obesity (BMI 30-39.9)    Obesity hypoventilation syndrome (HCC)    Tobacco use     Past Surgical History:  Procedure Laterality Date   CESAREAN SECTION     DILATION AND CURETTAGE OF UTERUS     TONSILLECTOMY     TUBAL LIGATION       (Not in an outpatient encounter)    Allergies  Allergen Reactions   Cetirizine Hives   Advil [Ibuprofen] Hives and Itching   Bactrim [Sulfamethoxazole-Trimethoprim] Itching    Itching, face swollen and problems breathing    Social History   Socioeconomic History   Marital status: Married    Spouse name: Not on file   Number of children: Not on file   Years of education: Not on file   Highest education level: Not on file  Occupational History   Not on file  Tobacco Use   Smoking status: Every Day    Current packs/day: 0.25    Average packs/day: 0.3 packs/day for 15.0 years (3.8 ttl pk-yrs)    Types: Cigarettes   Smokeless tobacco: Never  Vaping Use   Vaping status: Never Used  Substance and Sexual Activity   Alcohol use: Not Currently   Drug use: No   Sexual activity: Never    Birth control/protection: Surgical  Other Topics Concern   Not on file  Social History Narrative   Not on file   Social Determinants of Health   Financial Resource Strain: Not on file  Food Insecurity: Not on file  Transportation Needs: Not on file  Physical Activity: Not on file  Stress: Not on file  Social Connections: Unknown (02/16/2022)   Received from [facility], [facility]   Social Network    Social Network: Not on file  Intimate Partner Violence: Unknown (01/08/2022)   Received from [facility], [facility]   HITS    Physically Hurt: Not on file    Insult or Talk Down To: Not on file    Threaten Physical Harm: Not on file    Scream or Curse: Not on file      History reviewed. No pertinent family history.   Review of Systems Pertinent items are noted in HPI.  Objective: Vital signs in last 24 hours:    05/13/2023    1:07 PM 05/10/2023   10:18 PM 05/04/2023    2:25 PM  Vitals with BMI  Height 5\' 10"     Weight 280 lbs  286 lbs 13 oz  BMI 40.18    Systolic  147 127  Diastolic  87 84  Pulse  97 85      EXAM: General: Well nourished, well developed. Awake, alert and oriented to time, place, person. Normal mood and affect. No apparent distress. Breathing room air.  Operative Lower Extremity: Alignment - Neutral Deformity - None Skin intact Tenderness to palpation - right  ankle 5/5 TA, PT, GS, Per, EHL, FHL Sensation intact to light touch throughout Palpable DP and PT pulses Special testing: None  The contralateral foot/ankle was examined for comparison and noted to be neurovascularly intact with no localized deformity, swelling, or tenderness.  Imaging Review All images taken were independently reviewed by me.  Assessment/Plan: The clinical and radiographic findings were reviewed and discussed at length with the patient.  The patient has  right ankle fx.  We spoke at length about the natural course of these findings. We discussed nonoperative and operative treatment options in detail.  The risks and benefits were presented and reviewed. The risks due to implant failure/irritation, infection, stiffness, nerve/vessel/tendon injury, wound healing issues, failure of this surgery, need for further surgery, thromboembolic events, amputation, death among others were discussed. The patient acknowledged the explanation and agreed to proceed with the plan.  Netta Cedars  Orthopaedic Surgery [facility]

## 2023-05-15 NOTE — Discharge Instructions (Signed)

## 2023-05-16 ENCOUNTER — Ambulatory Visit (HOSPITAL_BASED_OUTPATIENT_CLINIC_OR_DEPARTMENT_OTHER): Payer: Self-pay | Admitting: Certified Registered Nurse Anesthetist

## 2023-05-16 ENCOUNTER — Encounter (HOSPITAL_COMMUNITY): Payer: Self-pay | Admitting: Orthopaedic Surgery

## 2023-05-16 ENCOUNTER — Other Ambulatory Visit (HOSPITAL_BASED_OUTPATIENT_CLINIC_OR_DEPARTMENT_OTHER): Payer: Self-pay | Admitting: Orthopaedic Surgery

## 2023-05-16 ENCOUNTER — Encounter (HOSPITAL_BASED_OUTPATIENT_CLINIC_OR_DEPARTMENT_OTHER): Payer: Self-pay | Admitting: Orthopaedic Surgery

## 2023-05-16 ENCOUNTER — Encounter (HOSPITAL_BASED_OUTPATIENT_CLINIC_OR_DEPARTMENT_OTHER)
Admission: RE | Disposition: A | Discharge status: Home / Self Care | Payer: Self-pay | Source: Home / Self Care | Attending: Orthopaedic Surgery

## 2023-05-16 ENCOUNTER — Ambulatory Visit (HOSPITAL_COMMUNITY)
Admit: 2023-05-16 | Discharge: 2023-05-16 | Disposition: A | Discharge status: Home / Self Care | Payer: Self-pay | Attending: Orthopaedic Surgery | Admitting: Orthopaedic Surgery

## 2023-05-16 ENCOUNTER — Ambulatory Visit (HOSPITAL_BASED_OUTPATIENT_CLINIC_OR_DEPARTMENT_OTHER)
Admission: RE | Admit: 2023-05-16 | Discharge: 2023-05-16 | Disposition: A | Discharge status: Home / Self Care | Payer: Self-pay | Attending: Orthopaedic Surgery | Admitting: Orthopaedic Surgery

## 2023-05-16 ENCOUNTER — Other Ambulatory Visit (HOSPITAL_COMMUNITY): Payer: Self-pay | Admitting: Orthopaedic Surgery

## 2023-05-16 ENCOUNTER — Other Ambulatory Visit: Payer: Self-pay

## 2023-05-16 DIAGNOSIS — S82841A Displaced bimalleolar fracture of right lower leg, initial encounter for closed fracture: Secondary | ICD-10-CM | POA: Insufficient documentation

## 2023-05-16 DIAGNOSIS — S82891A Other fracture of right lower leg, initial encounter for closed fracture: Secondary | ICD-10-CM

## 2023-05-16 DIAGNOSIS — X58XXXA Exposure to other specified factors, initial encounter: Secondary | ICD-10-CM | POA: Insufficient documentation

## 2023-05-16 DIAGNOSIS — Z01818 Encounter for other preprocedural examination: Secondary | ICD-10-CM

## 2023-05-16 DIAGNOSIS — T1490XA Injury, unspecified, initial encounter: Secondary | ICD-10-CM

## 2023-05-16 DIAGNOSIS — S99911A Unspecified injury of right ankle, initial encounter: Secondary | ICD-10-CM

## 2023-05-16 DIAGNOSIS — F17210 Nicotine dependence, cigarettes, uncomplicated: Secondary | ICD-10-CM | POA: Insufficient documentation

## 2023-05-16 HISTORY — PX: ORIF ANKLE FRACTURE: SHX5408

## 2023-05-16 LAB — POCT PREGNANCY, URINE: Preg Test, Ur: NEGATIVE

## 2023-05-16 SURGERY — OPEN REDUCTION INTERNAL FIXATION (ORIF) ANKLE FRACTURE
Anesthesia: General | Site: Ankle | Laterality: Right

## 2023-05-16 MED ORDER — PROMETHAZINE HCL 25 MG/ML IJ SOLN: 6.2500 mg | INTRAMUSCULAR | Status: DC | PRN

## 2023-05-16 MED ORDER — MIDAZOLAM HCL 2 MG/2ML IJ SOLN
INTRAMUSCULAR | Status: AC
  Filled 2023-05-16: qty 2

## 2023-05-16 MED ORDER — MIDAZOLAM HCL 2 MG/2ML IJ SOLN
2.0000 mg | Freq: Once | INTRAMUSCULAR | Status: AC
  Administered 2023-05-16: 2 mg via INTRAVENOUS

## 2023-05-16 MED ORDER — ONDANSETRON HCL 4 MG/2ML IJ SOLN
INTRAMUSCULAR | Status: DC | PRN
  Administered 2023-05-16: 4 mg via INTRAVENOUS

## 2023-05-16 MED ORDER — CHLORHEXIDINE GLUCONATE 4 % EX SOLN: 60.0000 mL | Freq: Once | CUTANEOUS | Status: DC

## 2023-05-16 MED ORDER — OXYCODONE HCL 5 MG/5ML PO SOLN: 5.0000 mg | Freq: Once | ORAL | Status: DC | PRN

## 2023-05-16 MED ORDER — PHENYLEPHRINE HCL (PRESSORS) 10 MG/ML IV SOLN
INTRAVENOUS | Status: DC | PRN
  Administered 2023-05-16: 160 ug via INTRAVENOUS
  Administered 2023-05-16 (×3): 80 ug via INTRAVENOUS
  Administered 2023-05-16: 160 ug via INTRAVENOUS

## 2023-05-16 MED ORDER — MIDAZOLAM HCL 5 MG/5ML IJ SOLN
INTRAMUSCULAR | Status: DC | PRN
  Administered 2023-05-16: 2 mg via INTRAVENOUS

## 2023-05-16 MED ORDER — PROPOFOL 10 MG/ML IV BOLUS
INTRAVENOUS | Status: AC
  Filled 2023-05-16: qty 20

## 2023-05-16 MED ORDER — ROPIVACAINE HCL 5 MG/ML IJ SOLN
INTRAMUSCULAR | Status: DC | PRN
  Administered 2023-05-16: 50 mL via PERINEURAL

## 2023-05-16 MED ORDER — PROPOFOL 10 MG/ML IV BOLUS
INTRAVENOUS | Status: DC | PRN
Start: 2023-05-16 — End: 2023-05-16
  Administered 2023-05-16: 200 mg via INTRAVENOUS

## 2023-05-16 MED ORDER — VANCOMYCIN HCL 500 MG IV SOLR
INTRAVENOUS | Status: DC | PRN
  Administered 2023-05-16: 500 mg via TOPICAL

## 2023-05-16 MED ORDER — HYDROMORPHONE HCL 1 MG/ML IJ SOLN: 0.2500 mg | INTRAMUSCULAR | Status: DC | PRN

## 2023-05-16 MED ORDER — MEPERIDINE HCL 25 MG/ML IJ SOLN: 6.2500 mg | INTRAMUSCULAR | Status: DC | PRN

## 2023-05-16 MED ORDER — LIDOCAINE HCL (CARDIAC) PF 100 MG/5ML IV SOSY
PREFILLED_SYRINGE | INTRAVENOUS | Status: DC | PRN
  Administered 2023-05-16: 50 mg via INTRAVENOUS

## 2023-05-16 MED ORDER — LIDOCAINE 2% (20 MG/ML) 5 ML SYRINGE
INTRAMUSCULAR | Status: AC
  Filled 2023-05-16: qty 5

## 2023-05-16 MED ORDER — FENTANYL CITRATE (PF) 100 MCG/2ML IJ SOLN
100.0000 ug | Freq: Once | INTRAMUSCULAR | Status: AC
  Administered 2023-05-16: 100 ug via INTRAVENOUS

## 2023-05-16 MED ORDER — FENTANYL CITRATE (PF) 100 MCG/2ML IJ SOLN
INTRAMUSCULAR | Status: DC | PRN
  Administered 2023-05-16: 100 ug via INTRAVENOUS

## 2023-05-16 MED ORDER — PHENYLEPHRINE 80 MCG/ML (10ML) SYRINGE FOR IV PUSH (FOR BLOOD PRESSURE SUPPORT)
PREFILLED_SYRINGE | INTRAVENOUS | Status: AC
  Filled 2023-05-16: qty 10

## 2023-05-16 MED ORDER — DEXAMETHASONE SODIUM PHOSPHATE 4 MG/ML IJ SOLN
INTRAMUSCULAR | Status: DC | PRN
  Administered 2023-05-16: 8 mg via INTRAVENOUS

## 2023-05-16 MED ORDER — LACTATED RINGERS IV SOLN: INTRAVENOUS | Status: DC

## 2023-05-16 MED ORDER — DEXAMETHASONE SODIUM PHOSPHATE 10 MG/ML IJ SOLN
INTRAMUSCULAR | Status: AC
  Filled 2023-05-16: qty 1

## 2023-05-16 MED ORDER — CEFAZOLIN IN SODIUM CHLORIDE 3-0.9 GM/100ML-% IV SOLN
3.0000 g | INTRAVENOUS | Status: AC
  Administered 2023-05-16: 3 g via INTRAVENOUS

## 2023-05-16 MED ORDER — CEFAZOLIN IN SODIUM CHLORIDE 3-0.9 GM/100ML-% IV SOLN
INTRAVENOUS | Status: AC
  Filled 2023-05-16: qty 100

## 2023-05-16 MED ORDER — BUPIVACAINE HCL (PF) 0.25 % IJ SOLN
INTRAMUSCULAR | Status: AC
  Filled 2023-05-16: qty 30

## 2023-05-16 MED ORDER — FENTANYL CITRATE (PF) 100 MCG/2ML IJ SOLN
INTRAMUSCULAR | Status: AC
  Filled 2023-05-16: qty 2

## 2023-05-16 MED ORDER — 0.9 % SODIUM CHLORIDE (POUR BTL) OPTIME
TOPICAL | Status: DC | PRN
  Administered 2023-05-16: 300 mL

## 2023-05-16 MED ORDER — OXYCODONE HCL 5 MG PO TABS: 5.0000 mg | ORAL_TABLET | Freq: Once | ORAL | Status: DC | PRN

## 2023-05-16 MED ORDER — AMISULPRIDE (ANTIEMETIC) 5 MG/2ML IV SOLN: 10.0000 mg | Freq: Once | INTRAVENOUS | Status: DC | PRN

## 2023-05-16 SURGICAL SUPPLY — 83 items
APL PRP STRL LF DISP 70% ISPRP (MISCELLANEOUS) ×1
BANDAGE ESMARK 6X9 LF (GAUZE/BANDAGES/DRESSINGS) ×1 IMPLANT
BIT DRILL 2.5X2.75 QC CALB (BIT) IMPLANT
BLADE SURG 15 STRL LF DISP TIS (BLADE) ×4 IMPLANT
BLADE SURG 15 STRL SS (BLADE) ×4
BNDG CMPR 5X4 CHSV STRCH STRL (GAUZE/BANDAGES/DRESSINGS) ×1
BNDG CMPR 5X4 KNIT ELC UNQ LF (GAUZE/BANDAGES/DRESSINGS)
BNDG CMPR 6 X 5 YARDS HK CLSR (GAUZE/BANDAGES/DRESSINGS)
BNDG CMPR 9X6 STRL LF SNTH (GAUZE/BANDAGES/DRESSINGS) ×1
BNDG CMPR MED 10X6 ELC LF (GAUZE/BANDAGES/DRESSINGS) ×1
BNDG COHESIVE 4X5 TAN STRL LF (GAUZE/BANDAGES/DRESSINGS) ×1 IMPLANT
BNDG ELASTIC 4INX 5YD STR LF (GAUZE/BANDAGES/DRESSINGS) IMPLANT
BNDG ELASTIC 6INX 5YD STR LF (GAUZE/BANDAGES/DRESSINGS) IMPLANT
BNDG ELASTIC 6X10 VLCR STRL LF (GAUZE/BANDAGES/DRESSINGS) ×1 IMPLANT
BNDG ESMARK 6X9 LF (GAUZE/BANDAGES/DRESSINGS) ×1
BNDG GAUZE DERMACEA FLUFF 4 (GAUZE/BANDAGES/DRESSINGS) ×1 IMPLANT
BNDG GZE DERMACEA 4 6PLY (GAUZE/BANDAGES/DRESSINGS) ×1
BRUSH SCRUB EZ 4% CHG (MISCELLANEOUS) ×1 IMPLANT
CANISTER SUCT 1200ML W/VALVE (MISCELLANEOUS) ×1 IMPLANT
CHLORAPREP W/TINT 26 (MISCELLANEOUS) ×1 IMPLANT
COVER BACK TABLE 60X90IN (DRAPES) ×1 IMPLANT
CUFF TOURN SGL QUICK 34 (TOURNIQUET CUFF) ×1
CUFF TRNQT CYL 34X4.125X (TOURNIQUET CUFF) ×1 IMPLANT
DRAPE C-ARM 42X72 X-RAY (DRAPES) ×1 IMPLANT
DRAPE C-ARMOR (DRAPES) ×1 IMPLANT
DRAPE EXTREMITY T 121X128X90 (DISPOSABLE) ×1 IMPLANT
DRAPE IMP U-DRAPE 54X76 (DRAPES) ×1 IMPLANT
DRAPE U-SHAPE 47X51 STRL (DRAPES) ×1 IMPLANT
DRSG MEPITEL 4X7.2 (GAUZE/BANDAGES/DRESSINGS) ×1 IMPLANT
ELECT REM PT RETURN 9FT ADLT (ELECTROSURGICAL) ×1
ELECTRODE REM PT RTRN 9FT ADLT (ELECTROSURGICAL) ×1 IMPLANT
FIXATION ZIPTIGHT ANKLE SNDSMS (Ankle) IMPLANT
GAUZE PAD ABD 8X10 STRL (GAUZE/BANDAGES/DRESSINGS) ×5 IMPLANT
GAUZE SPONGE 4X4 12PLY STRL (GAUZE/BANDAGES/DRESSINGS) ×1 IMPLANT
GLOVE BIOGEL PI IND STRL 8 (GLOVE) ×1 IMPLANT
GLOVE SURG SS PI 7.5 STRL IVOR (GLOVE) ×2 IMPLANT
GOWN STRL REUS W/ TWL LRG LVL3 (GOWN DISPOSABLE) ×2 IMPLANT
GOWN STRL REUS W/TWL LRG LVL3 (GOWN DISPOSABLE) ×2
K-WIRE ACE 1.6X6 (WIRE) ×2
KWIRE ACE 1.6X6 (WIRE) IMPLANT
MARKER SKIN DUAL TIP RULER LAB (MISCELLANEOUS) IMPLANT
NDL HYPO 25X1 1.5 SAFETY (NEEDLE) IMPLANT
NEEDLE HYPO 25X1 1.5 SAFETY (NEEDLE) IMPLANT
NS IRRIG 1000ML POUR BTL (IV SOLUTION) ×1 IMPLANT
PACK BASIN DAY SURGERY FS (CUSTOM PROCEDURE TRAY) ×1 IMPLANT
PAD CAST 4YDX4 CTTN HI CHSV (CAST SUPPLIES) ×1 IMPLANT
PADDING CAST ABS COTTON 4X4 ST (CAST SUPPLIES) IMPLANT
PADDING CAST COTTON 4X4 STRL (CAST SUPPLIES) ×1
PADDING CAST SYNTHETIC 4X4 STR (CAST SUPPLIES) ×2 IMPLANT
PADDING CAST SYNTHETIC 6X4 NS (CAST SUPPLIES) ×2 IMPLANT
PENCIL SMOKE EVACUATOR (MISCELLANEOUS) ×1 IMPLANT
PLATE LOCK 7H 92 BILAT FIB (Plate) IMPLANT
SCREW LOCK 3.5X12 DIST TIB (Screw) IMPLANT
SCREW LOCK 3.5X14 DIST TIB (Screw) IMPLANT
SCREW LOCK CORT STAR 3.5X12 (Screw) IMPLANT
SCREW LOCK CORT STAR 3.5X14 (Screw) IMPLANT
SCREW LOW PROFILE 12MMX3.5MM (Screw) IMPLANT
SCREW NON LOCKING LP 3.5 14MM (Screw) IMPLANT
SCREW NON LOCKING LP 3.5 16MM (Screw) IMPLANT
SHEET MEDIUM DRAPE 40X70 STRL (DRAPES) ×1 IMPLANT
SLEEVE SCD COMPRESS KNEE MED (STOCKING) ×1 IMPLANT
SPIKE FLUID TRANSFER (MISCELLANEOUS) IMPLANT
SPLINT PLASTER CAST FAST 5X30 (CAST SUPPLIES) ×20 IMPLANT
SPONGE T-LAP 18X18 ~~LOC~~+RFID (SPONGE) ×1 IMPLANT
STAPLER VISISTAT 35W (STAPLE) IMPLANT
STOCKINETTE 6 STRL (DRAPES) ×1 IMPLANT
STOCKINETTE ORTHO 6X25 (MISCELLANEOUS) ×1 IMPLANT
SUCTION TUBE FRAZIER 10FR DISP (SUCTIONS) IMPLANT
SUT ETHILON 2 0 FS 18 (SUTURE) ×2 IMPLANT
SUT MNCRL AB 3-0 PS2 18 (SUTURE) IMPLANT
SUT PDS 3-0 CT2 (SUTURE)
SUT PDS II 3-0 CT2 27 ABS (SUTURE) IMPLANT
SUT VIC AB 2-0 SH 27 (SUTURE) ×1
SUT VIC AB 2-0 SH 27XBRD (SUTURE) ×1 IMPLANT
SUT VIC AB 3-0 SH 27 (SUTURE)
SUT VIC AB 3-0 SH 27X BRD (SUTURE) IMPLANT
SUT VICRYL 0 SH 27 (SUTURE) IMPLANT
SYR BULB IRRIG 60ML STRL (SYRINGE) ×1 IMPLANT
SYR CONTROL 10ML LL (SYRINGE) IMPLANT
TOWEL GREEN STERILE FF (TOWEL DISPOSABLE) ×2 IMPLANT
TUBE CONNECTING 20X1/4 (TUBING) ×1 IMPLANT
UNDERPAD 30X36 HEAVY ABSORB (UNDERPADS AND DIAPERS) ×1 IMPLANT
ZIPTIGHT ANKLE SYNODESMOSS FIX (Ankle) ×1 IMPLANT

## 2023-05-16 NOTE — Anesthesia Procedure Notes (Signed)
Anesthesia Regional Block: Adductor canal block   Pre-Anesthetic Checklist: , timeout performed,  Correct Patient, Correct Site, Correct Laterality,  Correct Procedure, Correct Position, site marked,  Risks and benefits discussed,  Surgical consent,  Pre-op evaluation,  At surgeon's request and post-op pain management  Laterality: Right  Prep: chloraprep       Needles:  Injection technique: Single-shot  Needle Type: Stimiplex     Needle Length: 9cm  Needle Gauge: 21     Additional Needles:   Procedures:,,,, ultrasound used (permanent image in chart),,    Narrative:  Start time: 05/16/2023 9:19 AM End time: 05/16/2023 9:24 AM Injection made incrementally with aspirations every 5 mL.  Performed by: Personally  Anesthesiologist: Lowella Curb, MD

## 2023-05-16 NOTE — Anesthesia Postprocedure Evaluation (Signed)
Anesthesia Post Note  Patient: Elizabeth Campbell  Procedure(s) Performed: OPEN REDUCTION INTERNAL FIXATION (ORIF) bimalleolar ANKLE FRACTURE (Right: Ankle)     Patient location during evaluation: PACU Anesthesia Type: General Level of consciousness: awake and alert and oriented Pain management: pain level controlled Vital Signs Assessment: post-procedure vital signs reviewed and stable Respiratory status: spontaneous breathing, nonlabored ventilation and respiratory function stable Cardiovascular status: blood pressure returned to baseline and stable Postop Assessment: no apparent nausea or vomiting Anesthetic complications: no   No notable events documented.  Last Vitals:  Vitals:   05/16/23 1230 05/16/23 1240  BP: 132/80   Pulse: 88   Resp: 14   Temp:    SpO2: 94% 91%    Last Pain:  Vitals:   05/16/23 1216  TempSrc:   PainSc: Asleep                 Jullian Previti A.

## 2023-05-16 NOTE — Anesthesia Procedure Notes (Signed)
Procedure Name: LMA Insertion Date/Time: 05/16/2023 10:51 AM  Performed by: Cleda Clarks, CRNAPre-anesthesia Checklist: Patient identified, Emergency Drugs available, Suction available and Patient being monitored Patient Re-evaluated:Patient Re-evaluated prior to induction Oxygen Delivery Method: Circle system utilized Preoxygenation: Pre-oxygenation with 100% oxygen Induction Type: IV induction Ventilation: Mask ventilation without difficulty LMA: LMA inserted LMA Size: 4.0 Number of attempts: 1 Placement Confirmation: positive ETCO2 Tube secured with: Tape Dental Injury: Teeth and Oropharynx as per pre-operative assessment

## 2023-05-16 NOTE — Anesthesia Procedure Notes (Signed)
Anesthesia Regional Block: Popliteal block   Pre-Anesthetic Checklist: , timeout performed,  Correct Patient, Correct Site, Correct Laterality,  Correct Procedure, Correct Position, site marked,  Risks and benefits discussed,  Surgical consent,  Pre-op evaluation,  At surgeon's request and post-op pain management  Laterality: Right  Prep: chloraprep       Needles:  Injection technique: Single-shot  Needle Type: Stimiplex     Needle Length: 9cm  Needle Gauge: 21     Additional Needles:   Procedures:,,,, ultrasound used (permanent image in chart),,    Narrative:  Start time: 05/16/2023 9:20 AM End time: 05/16/2023 9:25 AM Injection made incrementally with aspirations every 5 mL.  Performed by: Personally  Anesthesiologist: Lowella Curb, MD

## 2023-05-16 NOTE — Anesthesia Preprocedure Evaluation (Signed)
Anesthesia Evaluation  Patient identified by MRN, date of birth, ID band Patient awake    Reviewed: Allergy & Precautions, H&P , NPO status , Patient's Chart, lab work & pertinent test results  Airway Mallampati: II  TM Distance: >3 FB Neck ROM: Full    Dental no notable dental hx.    Pulmonary sleep apnea , Current Smoker and Patient abstained from smoking.   Pulmonary exam normal breath sounds clear to auscultation       Cardiovascular negative cardio ROS Normal cardiovascular exam Rhythm:Regular Rate:Normal     Neuro/Psych negative neurological ROS  negative psych ROS   GI/Hepatic negative GI ROS, Neg liver ROS,,,  Endo/Other  negative endocrine ROS    Renal/GU negative Renal ROS  negative genitourinary   Musculoskeletal negative musculoskeletal ROS (+)    Abdominal  (+) + obese  Peds negative pediatric ROS (+)  Hematology negative hematology ROS (+)   Anesthesia Other Findings   Reproductive/Obstetrics negative OB ROS                             Anesthesia Physical Anesthesia Plan  ASA: 2  Anesthesia Plan: General   Post-op Pain Management: Regional block* and Minimal or no pain anticipated   Induction: Intravenous  PONV Risk Score and Plan: 2 and Ondansetron, Midazolam and Treatment may vary due to age or medical condition  Airway Management Planned: LMA  Additional Equipment:   Intra-op Plan:   Post-operative Plan: Extubation in OR  Informed Consent: I have reviewed the patients History and Physical, chart, labs and discussed the procedure including the risks, benefits and alternatives for the proposed anesthesia with the patient or authorized representative who has indicated his/her understanding and acceptance.     Dental advisory given  Plan Discussed with: CRNA  Anesthesia Plan Comments:        Anesthesia Quick Evaluation

## 2023-05-16 NOTE — Transfer of Care (Signed)
Immediate Anesthesia Transfer of Care Note  Patient: Elizabeth Campbell  Procedure(s) Performed: OPEN REDUCTION INTERNAL FIXATION (ORIF) bimalleolar ANKLE FRACTURE (Right: Ankle)  Patient Location: PACU  Anesthesia Type:GA combined with regional for post-op pain  Level of Consciousness: awake, alert , and oriented  Airway & Oxygen Therapy: Patient Spontanous Breathing and Patient connected to face mask oxygen  Post-op Assessment: Report given to RN and Post -op Vital signs reviewed and stable  Post vital signs: Reviewed and stable  Last Vitals:  Vitals Value Taken Time  BP 132/95 05/16/23 1216  Temp    Pulse 91 05/16/23 1220  Resp 15 05/16/23 1220  SpO2 100 % 05/16/23 1220  Vitals shown include unfiled device data.  Last Pain:  Vitals:   05/16/23 0839  TempSrc: Oral  PainSc: 5          Complications: No notable events documented.

## 2023-05-16 NOTE — H&P (Signed)

## 2023-05-16 NOTE — Progress Notes (Signed)
Assisted Dr. Sabra Heck with right, adductor canal, popliteal, ultrasound guided block. Side rails up, monitors on throughout procedure. See vital signs in flow sheet. Tolerated Procedure well.

## 2023-05-17 ENCOUNTER — Encounter (HOSPITAL_BASED_OUTPATIENT_CLINIC_OR_DEPARTMENT_OTHER): Payer: Self-pay | Admitting: Orthopaedic Surgery

## 2023-05-17 NOTE — Op Note (Signed)
05/16/2023  12:11 AM   PATIENT: Elizabeth Campbell  42 y.o. female  MRN: [id number]   PRE-OPERATIVE DIAGNOSIS:   Closed bimalleolar fracture of right ankle   POST-OPERATIVE DIAGNOSIS:   Closed bimalleolar fracture of right ankle   PROCEDURE: 1] ORIF right distal fibula 2] ORIF right ankle syndesmosis   SURGEON:  Netta Cedars, MD   ASSISTANT: None   ANESTHESIA: General, regional   EBL: Minimal   TOURNIQUET:    Total Tourniquet Time Documented: Thigh (Right) - 59 minutes Total: Thigh (Right) - 59 minutes    COMPLICATIONS: None apparent   DISPOSITION: Extubated, awake and stable to recovery.   INDICATION FOR PROCEDURE: The patient presented with above diagnosis.  We discussed the diagnosis, alternative treatment options, risks and benefits of the above surgical intervention, as well as alternative non-operative treatments. All questions/concerns were addressed and the patient/family demonstrated appropriate understanding of the diagnosis, the procedure, the postoperative course, and overall prognosis. The patient wished to proceed with surgical intervention and signed an informed surgical consent as such, in each others presence prior to surgery.   PROCEDURE IN DETAIL: After preoperative consent was obtained and the correct operative site was identified, the patient was brought to the operating room supine on stretcher and transferred onto operating table. General anesthesia was induced. Preoperative antibiotics were administered. Surgical timeout was taken. The patient was then positioned supine with an ipsilateral hip bump. The operative lower extremity was prepped and draped in standard sterile fashion with a tourniquet around the thigh. The extremity was exsanguinated and the tourniquet was inflated to 275 mmHg.  A standard lateral incision was made over the distal fibula. Dissection was carried down to the level of the fibula and the fracture site  identified. The superficial peroneal nerve was identified and protected throughout the procedure. The fibula was noted to be shortened with interposed periosteum. The fibula was brought out to length. The fibula fracture was debrided and the edges defined to achieve cortical read. Reduction maneuver was performed using pointed reduction forceps and lobster forceps. In this manner, the fibula length was restored and fracture reduced. A lag screw was not placed given the orientation of fracture lines and comminution. Due to poor bone quality and extensive comminution at the fracture site, it was decided to use a locking distal fibula plate. We then selected a Zimmer locking plate to match the anatomy of the distal fibula and placed it laterally. This was implanted under intraoperative fluoroscopy with a combination of distal locking screws and proximal cortical & locking screws.  A manual external rotation stress radiograph was obtained and demonstrated widening of the ankle mortise. Given this intraoperative finding as well as preoperative subluxation, it was decided to reduce and fix the syndesmosis. Therefore a suture fixation system (ZipTight device) was implanted through the fibula plate in cannulated fashion to fix the syndesmosis. Anchor/button position was verified along anteromedial tibial cortex by fluoroscopy. A repeat stress radiograph showed complete stability of the ankle mortise to testing.  The surgical sites were thoroughly irrigated. The tourniquet was deflated and hemostasis achieved. Betadine and vancomycin powder were applied. The deep layers were closed using 2-0 vicryl. The skin was closed without tension.    The leg was cleaned with saline and sterile dressings with gauze were applied. A well padded bulky short leg splint was applied. The patient was awakened from anesthesia and transported to the recovery room in stable condition.    FOLLOW UP PLAN: -transfer to PACU, then  home -  strict NWB operative extremity, maximum elevation -maintain short leg splint until follow up -DVT ppx: Aspirin 81 mg twice daily while NWB -follow up as outpatient within 7-10 days for wound check with exchange of short leg splint to short leg cast -sutures out in 2-3 weeks in outpatient office   RADIOGRAPHS: AP, lateral, oblique and stress radiographs of the right ankle were obtained intraoperatively. These showed interval reduction and fixation of the fractures. Manual stress radiographs were taken and the ankle mortise and tibiofibular relationship were noted to be stable following fixation. All hardware is appropriately positioned and of the appropriate lengths. No other acute injuries are noted.   Netta Cedars Orthopaedic Surgery [facility]

## 2024-11-06 ENCOUNTER — Encounter: Payer: Self-pay | Admitting: Family Medicine

## 2024-11-06 ENCOUNTER — Ambulatory Visit: Payer: Self-pay | Admitting: Family Medicine

## 2024-11-06 ENCOUNTER — Ambulatory Visit: Payer: Self-pay

## 2024-11-06 DIAGNOSIS — J069 Acute upper respiratory infection, unspecified: Secondary | ICD-10-CM

## 2024-11-06 MED ORDER — PREDNISONE 20 MG PO TABS: 20.0000 mg | ORAL_TABLET | Freq: Every day | ORAL | 0 refills | Status: AC

## 2024-11-06 MED ORDER — BENZONATATE 100 MG PO CAPS: 100.0000 mg | ORAL_CAPSULE | Freq: Three times a day (TID) | ORAL | 0 refills | Status: AC | PRN

## 2024-11-06 NOTE — Progress Notes (Signed)
 "  Virtual Visit via Video Note  I connected with Elizabeth Campbell, on 11/06/2024 at 2:02 PM by video enabled telemedicine device and verified that I am speaking with the correct person using two identifiers.   Consent: I discussed the limitations, risks, security and privacy concerns of performing an evaluation and management service by telemedicine and the availability of in person appointments. I also discussed with the patient that there may be a patient responsible charge related to this service. The patient expressed understanding and agreed to proceed.   Location of Patient: Home  Location of Provider: Clinic   Persons participating in Telemedicine visit: Cyndi L Mcshea Dr. Delbert    Discussed the use of AI scribe software for clinical note transcription with the patient, who gave verbal consent to proceed.  History of Present Illness Elizabeth Campbell is a 44 year old female patient of Dr. Vicci with a history of prediabetes, morbid obesity who presents with shortness of breath and fever.  She developed shortness of breath and fever six days ago, starting at work. A COVID test was negative. Symptoms improved slightly over the weekend, then worsened on Monday with recurrent fever up to 101F, severe cough, and runny nose. Tylenol  reduces the fever temporarily.  The cough started dry and became productive with mucus Monday evening. Shortness of breath occurs mainly during coughing fits. She has nasal congestion, runny nose, but no sore throat with post-nasal drip, and a nasal-sounding voice, but no facial or sinus pressure. She is nauseous without vomiting and had poor appetite Sunday, with some improvement Monday but recurrent nausea Tuesday.  She has used Delsym for cough. She notes some wheezing and has not been tested for influenza. She has no chills and sweats. She recalls a similar respiratory illness a few years ago treated with an antibiotic and a prednisone   pack.      Past Medical History:  Diagnosis Date   Hydradenitis    Hypokalemia    Hypomagnesemia    Obesity (BMI 30-39.9)    Obesity hypoventilation syndrome (HCC)    Tobacco use    Allergies[1]  Medications Ordered Prior to Encounter[2]  ROS: See HPI  Observations/Objective: Awake, alert, oriented x3 Not in acute distress, nasal speech No facial tenderness on palpation or percussion of sinuses Normal mood      Latest Ref Rng & Units 05/04/2023    3:03 PM 11/19/2022   11:57 AM 05/13/2020    4:09 PM  CMP  Glucose 70 - 99 mg/dL 90  92  898   BUN 6 - 24 mg/dL 26  32  14   Creatinine 0.57 - 1.00 mg/dL 8.67  8.20  9.02   Sodium 134 - 144 mmol/L 138  140  138   Potassium 3.5 - 5.2 mmol/L 3.7  4.5  3.4   Chloride 96 - 106 mmol/L 100  103  97   CO2 20 - 29 mmol/L 25  19  25    Calcium 8.7 - 10.2 mg/dL 9.4  9.3  9.2   Total Protein 6.0 - 8.5 g/dL  7.6    Total Bilirubin 0.0 - 1.2 mg/dL  <9.7    Alkaline Phos 44 - 121 IU/L  122    AST 0 - 40 IU/L  18    ALT 0 - 32 IU/L  15      Lipid Panel  No results found for: CHOL, TRIG, HDL, CHOLHDL, VLDL, LDLCALC, LDLDIRECT, LABVLDL  Lab Results  Component Value Date  HGBA1C 5.4 11/19/2022     Assessment and plan:   Assessment & Plan Acute upper respiratory infection Symptoms suggest viral etiology, likely rhinovirus. Negative COVID-19 test per patient but this was taking on the same day symptoms commenced.  I would love to repeat a COVID test and order a flu test but this will not change management given antiviral for COVID is best initiated within 5 days of symptom onset an antiviral for the flu is initiated 48 hours after symptom onset.  She is also unable to drive to the clinic to undergo this test.  No bacterial infection signs, antibiotics not indicated. - Prescribed Tessalon  TID for cough. - Prescribed prednisone  20 mg daily for 5 days. - Advised rest, increased fluids, acetaminophen  for fever. -  Instructed to monitor symptoms, contact clinic if no improvement in 48 hours for possible chest x-ray and decision regarding antibiotics.     Meds ordered this encounter  Medications   predniSONE  (DELTASONE ) 20 MG tablet    Sig: Take 1 tablet (20 mg total) by mouth daily with breakfast.    Dispense:  5 tablet    Refill:  0   benzonatate  (TESSALON ) 100 MG capsule    Sig: Take 1 capsule (100 mg total) by mouth 3 (three) times daily as needed for cough.    Dispense:  30 capsule    Refill:  0    Follow Up Instructions: Keep appointment with PCP for follow-up of medical conditions   I discussed the assessment and treatment plan with the patient. The patient was provided an opportunity to ask questions and all were answered. The patient agreed with the plan and demonstrated an understanding of the instructions.   The patient was advised to call back or seek an in-person evaluation if the symptoms worsen or if the condition fails to improve as anticipated.     I provided 14 minutes total of Telehealth time during this encounter including median intraservice time, reviewing previous notes, investigations, ordering medications, medical decision making, coordinating care and patient verbalized understanding at the end of the visit.     Corrina Sabin, MD, FAAFP. [facility] [place], [place] [phone]   11/06/2024, 2:02 PM     [1]  Allergies Allergen Reactions   Cetirizine  Hives   Advil  [Ibuprofen ] Hives and Itching   Bactrim  [Sulfamethoxazole -Trimethoprim ] Itching    Itching, face swollen and problems breathing  [2]  Current Outpatient Medications on File Prior to Visit  Medication Sig Dispense Refill   acetaminophen  (TYLENOL ) 500 MG tablet Take 1,000 mg by mouth every 6 (six) hours as needed for mild pain.     aspirin EC 81 MG tablet Take 81 mg by mouth daily. Swallow whole.     HYDROcodone -acetaminophen  (NORCO/VICODIN) 5-325 MG tablet  Take 1 tablet by mouth every 4 (four) hours as needed. 15 tablet 0   Magnesium  250 MG TABS Take 250 mg by mouth daily.     Potassium 99 MG TABS Take 1-2 tablets by mouth daily.     vitamin B-12 1000 MCG tablet Take 1 tablet (1,000 mcg total) by mouth daily. 30 tablet 0   No current facility-administered medications on file prior to visit.   "
